# Patient Record
Sex: Female | Born: 2005 | Race: White | Hispanic: No | Marital: Single | Smoking: Never smoker
Health system: Southern US, Community
[De-identification: ages and names within clinical notes are randomized; demographics above are authoritative.]

## PROBLEM LIST (undated history)

## (undated) ENCOUNTER — Inpatient Hospital Stay (HOSPITAL_COMMUNITY): Payer: Self-pay

## (undated) DIAGNOSIS — F32A Depression, unspecified: Secondary | ICD-10-CM

## (undated) DIAGNOSIS — F419 Anxiety disorder, unspecified: Secondary | ICD-10-CM

## (undated) HISTORY — DX: Depression, unspecified: F32.A

## (undated) HISTORY — PX: NO PAST SURGERIES: SHX2092

## (undated) HISTORY — DX: Anxiety disorder, unspecified: F41.9

---
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
## 2024-02-02 ENCOUNTER — Encounter (HOSPITAL_COMMUNITY): Payer: Self-pay

## 2024-02-02 ENCOUNTER — Emergency Department (HOSPITAL_COMMUNITY)
Admission: EM | Admit: 2024-02-02 | Discharge: 2024-02-03 | Disposition: A | Discharge status: Home / Self Care | Attending: Emergency Medicine | Admitting: Emergency Medicine

## 2024-02-02 ENCOUNTER — Emergency Department (HOSPITAL_COMMUNITY)

## 2024-02-02 ENCOUNTER — Other Ambulatory Visit: Payer: Self-pay

## 2024-02-02 DIAGNOSIS — R519 Headache, unspecified: Secondary | ICD-10-CM | POA: Diagnosis present

## 2024-02-02 DIAGNOSIS — Z79899 Other long term (current) drug therapy: Secondary | ICD-10-CM | POA: Diagnosis not present

## 2024-02-02 DIAGNOSIS — H1031 Unspecified acute conjunctivitis, right eye: Secondary | ICD-10-CM | POA: Diagnosis not present

## 2024-02-02 DIAGNOSIS — R55 Syncope and collapse: Secondary | ICD-10-CM | POA: Insufficient documentation

## 2024-02-02 DIAGNOSIS — R000 Tachycardia, unspecified: Secondary | ICD-10-CM | POA: Diagnosis not present

## 2024-02-02 LAB — COMPREHENSIVE METABOLIC PANEL WITH GFR
ALT: 14 U/L (ref 0–44)
AST: 15 U/L (ref 15–41)
Albumin: 4.4 g/dL (ref 3.5–5.0)
Alkaline Phosphatase: 66 U/L (ref 38–126)
Anion gap: 9 (ref 5–15)
BUN: 9 mg/dL (ref 6–20)
CO2: 26 mmol/L (ref 22–32)
Calcium: 9.3 mg/dL (ref 8.9–10.3)
Chloride: 102 mmol/L (ref 98–111)
Creatinine, Ser: 0.64 mg/dL (ref 0.44–1.00)
GFR, Estimated: >60 mL/min (ref >60)
Glucose, Bld: 112 mg/dL — ABNORMAL HIGH (ref 70–99)
Potassium: 3.4 mmol/L — ABNORMAL LOW (ref 3.5–5.1)
Sodium: 137 mmol/L (ref 135–145)
Total Bilirubin: 1 mg/dL (ref 0.0–1.2)
Total Protein: 7.4 g/dL (ref 6.5–8.1)

## 2024-02-02 LAB — CBC
HCT: 44.3 % (ref 36.0–46.0)
Hemoglobin: 14.2 g/dL (ref 12.0–15.0)
MCH: 30.6 pg (ref 26.0–34.0)
MCHC: 32.1 g/dL (ref 30.0–36.0)
MCV: 95.5 fL (ref 80.0–100.0)
Platelets: 262 10*3/uL (ref 150–400)
RBC: 4.64 MIL/uL (ref 3.87–5.11)
RDW: 12.4 % (ref 11.5–15.5)
WBC: 12.1 10*3/uL — ABNORMAL HIGH (ref 4.0–10.5)
nRBC: 0 % (ref 0.0–0.2)

## 2024-02-02 LAB — URINALYSIS, ROUTINE W REFLEX MICROSCOPIC
Bilirubin Urine: NEGATIVE
Glucose, UA: NEGATIVE mg/dL
Ketones, ur: NEGATIVE mg/dL
Nitrite: NEGATIVE
Protein, ur: NEGATIVE mg/dL
Specific Gravity, Urine: 1.018 (ref 1.005–1.030)
pH: 5 (ref 5.0–8.0)

## 2024-02-02 LAB — HCG, SERUM, QUALITATIVE: Preg, Serum: NEGATIVE

## 2024-02-02 MED ORDER — KETOROLAC TROMETHAMINE 30 MG/ML IJ SOLN
30.0000 mg | Freq: Once | INTRAMUSCULAR | Status: AC
  Administered 2024-02-02: 30 mg via INTRAVENOUS
  Filled 2024-02-02: qty 1

## 2024-02-02 MED ORDER — DIPHENHYDRAMINE HCL 50 MG/ML IJ SOLN
25.0000 mg | Freq: Once | INTRAMUSCULAR | Status: AC
  Administered 2024-02-02: 25 mg via INTRAVENOUS
  Filled 2024-02-02: qty 1

## 2024-02-02 MED ORDER — METOCLOPRAMIDE HCL 5 MG/ML IJ SOLN
10.0000 mg | Freq: Once | INTRAMUSCULAR | Status: AC
  Administered 2024-02-02: 10 mg via INTRAVENOUS
  Filled 2024-02-02: qty 2

## 2024-02-02 MED ORDER — LACTATED RINGERS IV BOLUS
1000.0000 mL | Freq: Once | INTRAVENOUS | Status: AC
  Administered 2024-02-02: 1000 mL via INTRAVENOUS

## 2024-02-02 NOTE — ED Notes (Signed)
 Gave pt urine cup with instructions to give to "first look" when sample is obtained.

## 2024-02-02 NOTE — ED Notes (Signed)
 Pt coming from [facility] in [place] due to syncopal episode last night, migraine and tachycardia. Coming POV for evaluation.

## 2024-02-02 NOTE — ED Triage Notes (Signed)
 Patient said her vision got blurry last night and passed out for a few seconds. Stated she has had a migraine since. Denies blood thinners. Feels nauseous.

## 2024-02-02 NOTE — ED Provider Notes (Signed)
 [facility] Emergency Department Provider Note MRN:  [id number]  Arrival date & time: 02/03/24     Chief Complaint   Loss of Consciousness   History of Present Illness   Beverly Dennis is a 18 y.o. year-old female presents to the ED with chief complaint of syncopal event yesterday.  States that she felt confused.  States that she had a bad headache.  States that she woke up and noticed a blood shot eye.  States that she has had a migraine ever since passing out.  States that she is having associated worst headache, nausea, light sensitivity.  States that she has tried taking Tylenol for the headache.  States that before she passed out she was walking around.  She states that she felt like she was going to pass out and sat down.   States that her right eye feels irritated.    She states that the headache started on the right side of her head.    States that she has passed out one time before.   History provided by patient.   Review of Systems  Pertinent positive and negative review of systems noted in HPI.    Physical Exam   Vitals:   02/03/24 0024 02/03/24 0130  BP:  103/60  Pulse:  87  Resp:  18  Temp: 98.2 F (36.8 C)   SpO2:  99%    CONSTITUTIONAL:  non toxic-appearing, NAD NEURO:  Alert and oriented x 3, CN 3-12 grossly intact EYES:  eyes equal and reactive ENT/NECK:  Supple, no stridor  CARDIO:  Tachycardic, regular rhythm, appears well-perfused  PULM:  No respiratory distress, CTAB GI/GU:  non-distended, no focal abdominal tenderness MSK/SPINE:  No gross deformities, no edema, moves all extremities  SKIN:  no rash, atraumatic   *Additional and/or pertinent findings included in MDM below  Diagnostic and Interventional Summary    EKG Interpretation Date/Time:  Friday February 02 2024 23:50:31 EDT Ventricular Rate:  104 PR Interval:  122 QRS Duration:  88 QT Interval:  353 QTC Calculation: 465 R Axis:   97  Text  Interpretation: Sinus tachycardia Ventricular premature complex Borderline right axis deviation Borderline T wave abnormalities Baseline wander in lead(s) I II aVR V3 Confirmed by Townsend Freud [phone]) on 02/02/2024 11:59:22 PM       Labs Reviewed  COMPREHENSIVE METABOLIC PANEL WITH GFR - Abnormal; Notable for the following components:      Result Value   Potassium 3.4 (*)    Glucose, Bld 112 (*)    All other components within normal limits  CBC - Abnormal; Notable for the following components:   WBC 12.1 (*)    All other components within normal limits  URINALYSIS, ROUTINE W REFLEX MICROSCOPIC - Abnormal; Notable for the following components:   APPearance HAZY (*)    Hgb urine dipstick SMALL (*)    Leukocytes,Ua TRACE (*)    Bacteria, UA RARE (*)    All other components within normal limits  RESP PANEL BY RT-PCR (RSV, FLU A&B, COVID)  RVPGX2  HCG, SERUM, QUALITATIVE  TSH  MAGNESIUM  D-DIMER, QUANTITATIVE  CBG MONITORING, ED    CT Head Wo Contrast  Final Result      Medications  ketorolac  (TORADOL ) 30 MG/ML injection 30 mg (30 mg Intravenous Given 02/02/24 2339)  metoCLOPramide  (REGLAN ) injection 10 mg (10 mg Intravenous Given 02/02/24 2338)  diphenhydrAMINE  (BENADRYL ) injection 25 mg (25 mg Intravenous Given 02/02/24 2339)  lactated ringers  bolus 1,000 mL (0  mLs Intravenous Stopped 02/03/24 0130)  potassium chloride SA (KLOR-CON M) CR tablet 40 mEq (40 mEq Oral Given 02/03/24 0127)  lactated ringers  bolus 1,000 mL (1,000 mLs Intravenous New Bag/Given 02/03/24 0127)     Procedures  /  Critical Care Procedures  ED Course and Medical Decision Making  I have reviewed the triage vital signs, the nursing notes, and pertinent available records from the EMR.  Social Determinants Affecting Complexity of Care: Patient has no clinically significant social determinants affecting this chief complaint..   ED Course:    Medical Decision Making Patient here with syncope, headache, and  conjuctivitis.  She states that she passed out yesterday.  She knew she was going to pass out and lowered herself to the ground.    Labs and imaging ordered in triage are reassuring.  She did have a mild hypokalemia, which I treated with oral potassium.  She has a negative pregnancy, so doubt ectopic. Negative d-dimer, doubt PE.  No chest pain or SOB.  She was a bit tachycardic, but this resolved with fluids.    She was able to ambulate without dizziness.    Her headache improved significantly with the migraine cocktail.   I will have her follow-up with outpatient neuro for headaches.  She will need to follow-up with PCP for her syncope, but I don't seen any worrisome findings that would necessitate admission or further emergent workup.  Polytrim for the conjunctivitis.  We discussed return precautions.  Amount and/or Complexity of Data Reviewed Labs: ordered. ECG/medicine tests: ordered.  Risk Prescription drug management.         Consultants: No consultations were needed in caring for this patient.   Treatment and Plan: I considered admission due to patient's initial presentation, but after considering the examination and diagnostic results, patient will not require admission and can be discharged with outpatient follow-up.    Final Clinical Impressions(s) / ED Diagnoses     ICD-10-CM   1. Syncope, unspecified syncope type  R55     2. Acute nonintractable headache, unspecified headache type  R51.9 Ambulatory referral to Neurology    3. Acute conjunctivitis of right eye, unspecified acute conjunctivitis type  H10.31       ED Discharge Orders          Ordered    Ambulatory referral to Neurology       Comments: An appointment is requested in approximately: 2 weeks   02/03/24 0214    trimethoprim-polymyxin b (POLYTRIM) ophthalmic solution  Every 4 hours        02/03/24 0215              Discharge Instructions Discussed with and Provided to Patient:      Discharge Instructions      Please follow-up with  your primary care doctor for further evaluation of your syncope.    Please follow-up with the neurologist for your headaches.  Your scans and labs looked good.  Please return for new or worsening symptoms.  Return for fever.    Take medications as instructed.     Sherel Dikes, PA-C 02/03/24 1610    Lindle Rhea, MD 02/03/24 [phone]

## 2024-02-03 LAB — RESP PANEL BY RT-PCR (RSV, FLU A&B, COVID)  RVPGX2
Influenza A by PCR: NEGATIVE
Influenza B by PCR: NEGATIVE
Resp Syncytial Virus by PCR: NEGATIVE
SARS Coronavirus 2 by RT PCR: NEGATIVE

## 2024-02-03 LAB — D-DIMER, QUANTITATIVE: D-Dimer, Quant: <0.27 ug{FEU}/mL (ref 0.00–0.50)

## 2024-02-03 LAB — MAGNESIUM: Magnesium: 2.3 mg/dL (ref 1.7–2.4)

## 2024-02-03 LAB — TSH: TSH: 0.801 u[IU]/mL (ref 0.350–4.500)

## 2024-02-03 MED ORDER — LACTATED RINGERS IV BOLUS
1000.0000 mL | Freq: Once | INTRAVENOUS | Status: AC
  Administered 2024-02-03: 1000 mL via INTRAVENOUS

## 2024-02-03 MED ORDER — POLYMYXIN B-TRIMETHOPRIM 10000-0.1 UNIT/ML-% OP SOLN: 1.0000 [drp] | OPHTHALMIC | 0 refills | Status: AC

## 2024-02-03 MED ORDER — POTASSIUM CHLORIDE CRYS ER 20 MEQ PO TBCR
40.0000 meq | EXTENDED_RELEASE_TABLET | Freq: Once | ORAL | Status: AC
  Administered 2024-02-03: 40 meq via ORAL
  Filled 2024-02-03: qty 2

## 2024-02-03 NOTE — Discharge Instructions (Addendum)
 Please follow-up with  your primary care doctor for further evaluation of your syncope.    Please follow-up with the neurologist for your headaches.  Your scans and labs looked good.  Please return for new or worsening symptoms.  Return for fever.    Take medications as instructed.

## 2024-04-25 ENCOUNTER — Ambulatory Visit: Admitting: Radiology

## 2024-04-26 ENCOUNTER — Ambulatory Visit: Admitting: Radiology

## 2024-05-01 ENCOUNTER — Encounter: Payer: Self-pay | Admitting: Radiology

## 2024-05-01 ENCOUNTER — Ambulatory Visit: Admitting: Radiology

## 2024-05-01 VITALS — BP 120/62 | Wt 102.0 lb

## 2024-05-01 DIAGNOSIS — N912 Amenorrhea, unspecified: Secondary | ICD-10-CM

## 2024-05-01 DIAGNOSIS — Z3201 Encounter for pregnancy test, result positive: Secondary | ICD-10-CM

## 2024-05-01 LAB — PREGNANCY, URINE: Preg Test, Ur: POSITIVE — AB

## 2024-05-01 NOTE — Progress Notes (Signed)
   Areatha Mall May 31, 2006 [id number]   History:  18 y.o. Complains of late for period. LMP: 03/27/24, hx irregular periods, BC: none (not planning pregnancy) Had positive home pregnancy test last week. Plans on continuing pregnancy. Has not started PNV.   Gynecologic History Patient's last menstrual period was 03/27/2024 (approximate).   Contraception/Family planning: none Sexually active: yes   Obstetric History OB History  Gravida Para Term Preterm AB Living  0 0 0 0 0 0  SAB IAB Ectopic Multiple Live Births  0 0 0 0 0     The following portions of the patient's history were reviewed and updated as appropriate: allergies, current medications, past family history, past medical history, past social history, past surgical history, and problem list.  Review of Systems  All other systems reviewed and are negative.   Past medical history, past surgical history, family history and social history were all reviewed and documented in the EPIC chart.  Exam:  Vitals:   05/01/24 1127  BP: 120/62  Weight: 102 lb (46.3 kg)   Body mass index is 19.27 kg/m.  Physical Exam Vitals and nursing note reviewed.  Constitutional:      Appearance: Normal appearance. She is normal weight.  Neurological:     Mental Status: She is alert.  Psychiatric:        Mood and Affect: Mood normal.        Thought Content: Thought content normal.        Judgment: Judgment normal.      Darice Hoit, CMA present for exam  Assessment/Plan:   1. Positive pregnancy test (Primary)  2. Amenorrhea - Pregnancy, urine; positive  Options counseling completed. Will start PNV. Info given for OB offices and planned parenthood. Discussed nutrition, exercise, warning signs.    GINETTE COZIER B WHNP-BC 12:38 PM 05/01/2024

## 2024-05-10 ENCOUNTER — Other Ambulatory Visit: Payer: Self-pay

## 2024-05-10 ENCOUNTER — Emergency Department (HOSPITAL_BASED_OUTPATIENT_CLINIC_OR_DEPARTMENT_OTHER): Admission: EM | Admit: 2024-05-10 | Discharge: 2024-05-10 | Disposition: A | Discharge status: Home / Self Care

## 2024-05-10 ENCOUNTER — Encounter (HOSPITAL_BASED_OUTPATIENT_CLINIC_OR_DEPARTMENT_OTHER): Payer: Self-pay

## 2024-05-10 ENCOUNTER — Emergency Department (HOSPITAL_BASED_OUTPATIENT_CLINIC_OR_DEPARTMENT_OTHER)

## 2024-05-10 DIAGNOSIS — O200 Threatened abortion: Secondary | ICD-10-CM | POA: Insufficient documentation

## 2024-05-10 DIAGNOSIS — Z3A01 Less than 8 weeks gestation of pregnancy: Secondary | ICD-10-CM | POA: Diagnosis not present

## 2024-05-10 LAB — COMPREHENSIVE METABOLIC PANEL WITH GFR
ALT: 8 U/L (ref 0–44)
AST: 17 U/L (ref 15–41)
Albumin: 4.6 g/dL (ref 3.5–5.0)
Alkaline Phosphatase: 68 U/L (ref 38–126)
Anion gap: 16 — ABNORMAL HIGH (ref 5–15)
BUN: <5 mg/dL — ABNORMAL LOW (ref 6–20)
CO2: 20 mmol/L — ABNORMAL LOW (ref 22–32)
Calcium: 9.2 mg/dL (ref 8.9–10.3)
Chloride: 104 mmol/L (ref 98–111)
Creatinine, Ser: 0.6 mg/dL (ref 0.44–1.00)
GFR, Estimated: >60 mL/min (ref >60)
Glucose, Bld: 94 mg/dL (ref 70–99)
Potassium: 3.9 mmol/L (ref 3.5–5.1)
Sodium: 139 mmol/L (ref 135–145)
Total Bilirubin: 0.2 mg/dL (ref 0.0–1.2)
Total Protein: 6.9 g/dL (ref 6.5–8.1)

## 2024-05-10 LAB — CBC
HCT: 38.2 % (ref 36.0–46.0)
Hemoglobin: 13.1 g/dL (ref 12.0–15.0)
MCH: 30.8 pg (ref 26.0–34.0)
MCHC: 34.3 g/dL (ref 30.0–36.0)
MCV: 89.9 fL (ref 80.0–100.0)
Platelets: 299 K/uL (ref 150–400)
RBC: 4.25 MIL/uL (ref 3.87–5.11)
RDW: 11.4 % — ABNORMAL LOW (ref 11.5–15.5)
WBC: 9.1 K/uL (ref 4.0–10.5)
nRBC: 0 % (ref 0.0–0.2)

## 2024-05-10 LAB — HCG, QUANTITATIVE, PREGNANCY: hCG, Beta Chain, Quant, S: 12256 m[IU]/mL — ABNORMAL HIGH (ref <5)

## 2024-05-10 NOTE — ED Notes (Signed)
 Reviewed discharge instructions and follow up with pt. Pt states understanding. Ambulatory at discharge with friend

## 2024-05-10 NOTE — Discharge Instructions (Addendum)
 As we discussed, your ultrasound showed that you are approximately 6 weeks and 2 days gestation of pregnancy.  Your ultrasound did not show fetal heart tones, however that could be normal given how early along in your pregnancy that you are.  Your hCG was approximately 12,000.  I recommend that you follow-up with your OB/GYN next week to have a repeat ultrasound as well as to have repeat hCG testing to determine the viability of her pregnancy.  In the interim, monitor for any changes in your condition, such as developing severe pain, fevers, or vaginal bleeding.  Return if development of any new or worsening symptoms.

## 2024-05-10 NOTE — ED Triage Notes (Addendum)
 Arrives POV voicing concerns regarding current pregnancy. Patient states that she is about [redacted] weeks pregnant and she had an ultrasound today that showed no heartbeat. Patient here for a second opinion. She is not having vaginal bleeding.

## 2024-05-10 NOTE — ED Provider Notes (Signed)
 [facility] Provider Note   CSN: [id number] Arrival date & time: 05/10/24  1422     Patient presents with: Threatened Miscarriage   Beverly Dennis is a 18 y.o. female.   Patient with no pertinent past medical history presents today with concern for threatened miscarriage.  Reports that she is approximately [redacted] weeks pregnant.  She went to an OB/GYN office today and had an ultrasound and was told that they did not find a heartbeat.  She reports that they did not elaborate on this, just told her to follow-up at her appointment next week.  She presents requesting a second opinion.  Does endorse that she has had some mild abdominal cramping but no significant pain.  Denies nausea, vomiting, or diarrhea.  No vaginal bleeding or discharge.  No dysuria.  She is G1, P0  The history is provided by the patient. No language interpreter was used.       Prior to Admission medications   Medication Sig Start Date End Date Taking? Authorizing Provider  albuterol (PROAIR HFA) 108 (90 Base) MCG/ACT inhaler INHALE 2 PUFFS BY MOUTH EVERY 4 TO 6 HOURS AS NEEDED FOR COUGHING OR WHEEZING 05/10/18   [provider]  drospirenone -ethinyl estradiol  (YAZ) 3-0.02 MG tablet Take 1 tablet by mouth daily. Patient not taking: Reported on 05/01/2024 10/02/23   Glennon Almarie POUR, MD  HYDROXYZINE HCL PO Take by mouth. Patient not taking: Reported on 05/01/2024    [provider]    Allergies: Patient has no known allergies.    Review of Systems  All other systems reviewed and are negative.   Updated Vital Signs BP 121/79 (BP Location: Right Arm)   Pulse (!) 109   Temp 99.2 F (37.3 C)   Resp 20   Ht 5' 1 (1.549 m)   Wt 47.6 kg   LMP 03/27/2024 (Approximate)   SpO2 100%   BMI 19.84 kg/m   Physical Exam Vitals and nursing note reviewed.  Constitutional:      General: She is not in acute distress.    Appearance: Normal appearance. She is normal  weight. She is not ill-appearing, toxic-appearing or diaphoretic.  HENT:     Head: Normocephalic and atraumatic.  Cardiovascular:     Rate and Rhythm: Normal rate.  Pulmonary:     Effort: Pulmonary effort is normal. No respiratory distress.  Abdominal:     General: Abdomen is flat.     Palpations: Abdomen is soft.     Tenderness: There is no abdominal tenderness.  Musculoskeletal:        General: Normal range of motion.     Cervical back: Normal range of motion.  Skin:    General: Skin is warm and dry.  Neurological:     General: No focal deficit present.     Mental Status: She is alert.  Psychiatric:        Mood and Affect: Mood normal.        Behavior: Behavior normal.     (all labs ordered are listed, but only abnormal results are displayed) Labs Reviewed  HCG, QUANTITATIVE, PREGNANCY - Abnormal; Notable for the following components:      Result Value   hCG, Beta Chain, Quant, S 12,256 (*)    All other components within normal limits  CBC - Abnormal; Notable for the following components:   RDW 11.4 (*)    All other components within normal limits  COMPREHENSIVE METABOLIC PANEL WITH GFR -  Abnormal; Notable for the following components:   CO2 20 (*)    BUN <5 (*)    Anion gap 16 (*)    All other components within normal limits    EKG: None  Radiology: US  OB LESS THAN 14 WEEKS WITH OB TRANSVAGINAL Result Date: 05/10/2024 CLINICAL DATA:  Threatened miscarriage in early pregnancy EXAM: OBSTETRIC <14 WK US  AND TRANSVAGINAL OB US  TECHNIQUE: Both transabdominal and transvaginal ultrasound examinations were performed for complete evaluation of the gestation as well as the maternal uterus, adnexal regions, and pelvic cul-de-sac. Transvaginal technique was performed to assess early pregnancy. COMPARISON:  11/07/2023 pelvic ultrasound FINDINGS: Intrauterine gestational sac: Present Yolk sac:  Present Embryo:  Present Cardiac Activity: Absent Heart Rate: N/A bpm CRL:  5.6 mm   6 w    2 d                  US  EDC: 01/01/2025 Subchorionic hemorrhage:  None visualized. Maternal uterus/adnexae: Unremarkable aside from trace free pelvic fluid. IMPRESSION: 1. Single intrauterine pregnancy measuring at 6 weeks 2 days gestation. No currently visible embryonic cardiac activity, which may be due to the early stage of the pregnancy and is not considered indicative of embryonic demise at this time based on the small size of the embryo (crown-rump length a 5.6 mm). Recommend follow-up quantitative B-HCG levels and follow-up ultrasound as clinically warranted to assess viability. Electronically Signed   By: Ryan Salvage M.D.   On: 05/10/2024 17:56     Procedures   Medications Ordered in the ED - No data to display                                  Medical Decision Making Amount and/or Complexity of Data Reviewed Labs: ordered. Radiology: ordered.   This patient is a 18 y.o. female who presents to the ED for concern of pelvic pain in pregnancy, this involves an extensive number of treatment options, and is a complaint that carries with it a high risk of complications and morbidity. The emergent differential diagnosis prior to evaluation includes, but is not limited to,  PID, appendicitis, kidney stone, septic abortion, ruptured ovarian cyst, ovarian torsion, tubo-ovarian abscess, fibroids, endometriosis, diverticulitis, cystitis, ectopic pregnancy, dysmenorrhea, septic abortion  This is not an exhaustive differential.   Past Medical History / Co-morbidities / Social History:  has no past medical history on file.  Patient is G1P0  Additional history: Chart reviewed. Pertinent results include: Unable to review patients visit from earlier today.  Physical Exam: Physical exam performed. The pertinent findings include: Well-appearing, abdomen soft and nontender.  Lab Tests: I ordered, and personally interpreted labs.  The pertinent results include:  hcg quantitative 12,256.  No  other acute laboratory abnormalities.   Imaging Studies: I ordered imaging studies including Pelvic US . I independently visualized and interpreted imaging which showed   1. Single intrauterine pregnancy measuring at 6 weeks 2 days gestation. No currently visible embryonic cardiac activity, which may be due to the early stage of the pregnancy and is not considered indicative of embryonic demise at this time based on the small size of the embryo (crown-rump length a 5.6 mm). Recommend follow-up quantitative B-HCG levels and follow-up ultrasound as clinically warranted to assess viability.  I agree with the radiologist interpretation.   Disposition: After consideration of the diagnostic results and the patients response to treatment, I feel that emergency department workup does not  suggest an emergent condition requiring admission or immediate intervention beyond what has been performed at this time. The plan is: Discharge with close outpatient OB/GYN follow-up and return precautions.  Patient's workup is benign.  Her abdomen is soft and nontender.  It her ultrasound did not show fetal heart tones, however it could be due to early stage of pregnancy.  Discussed same with patient is understanding.  She is not having any bleeding or spotting.  Her pain seems minimal and therefore low suspicion for acute intra or abdominal etiology of symptoms such as ovarian torsion or appendicitis.  No indication for further evaluation with additional labs or imaging at this time.  Recommend that she see her OB/GYN at her already scheduled appointment next week to trend her hCG and have a repeat ultrasound to determine viability of pregnancy.  Close return precautions provided. Evaluation and diagnostic testing in the emergency department does not suggest an emergent condition requiring admission or immediate intervention beyond what has been performed at this time.  Plan for discharge with close PCP follow-up.  Patient is  understanding and amenable with plan, educated on red flag symptoms that would prompt immediate return.  Patient discharged in stable condition.  Final diagnoses:  Less than [redacted] weeks gestation of pregnancy    ED Discharge Orders     None     An After Visit Summary was printed and given to the patient.      Kamren Heskett A, PA-C 05/10/24 1818    Simon Lavonia SAILOR, MD 05/10/24 [phone]

## 2024-05-14 ENCOUNTER — Ambulatory Visit: Payer: Medicaid Other | Admitting: Radiology

## 2024-05-17 ENCOUNTER — Encounter: Payer: Self-pay | Admitting: *Deleted

## 2024-05-20 ENCOUNTER — Other Ambulatory Visit (HOSPITAL_COMMUNITY)
Admission: RE | Admit: 2024-05-20 | Discharge: 2024-05-20 | Disposition: A | Discharge status: Home / Self Care | Source: Ambulatory Visit | Attending: Obstetrics and Gynecology | Admitting: Obstetrics and Gynecology

## 2024-05-20 ENCOUNTER — Other Ambulatory Visit (INDEPENDENT_AMBULATORY_CARE_PROVIDER_SITE_OTHER): Payer: Self-pay

## 2024-05-20 ENCOUNTER — Ambulatory Visit: Admitting: *Deleted

## 2024-05-20 VITALS — BP 112/70 | HR 96

## 2024-05-20 DIAGNOSIS — Z3480 Encounter for supervision of other normal pregnancy, unspecified trimester: Secondary | ICD-10-CM | POA: Insufficient documentation

## 2024-05-20 DIAGNOSIS — Z3A01 Less than 8 weeks gestation of pregnancy: Secondary | ICD-10-CM

## 2024-05-20 DIAGNOSIS — Z3401 Encounter for supervision of normal first pregnancy, first trimester: Secondary | ICD-10-CM | POA: Diagnosis not present

## 2024-05-20 DIAGNOSIS — O3680X0 Pregnancy with inconclusive fetal viability, not applicable or unspecified: Secondary | ICD-10-CM

## 2024-05-20 DIAGNOSIS — O0990 Supervision of high risk pregnancy, unspecified, unspecified trimester: Secondary | ICD-10-CM

## 2024-05-20 LAB — CERVICOVAGINAL ANCILLARY ONLY
Chlamydia: POSITIVE — AB
Comment: NEGATIVE
Comment: NORMAL
Neisseria Gonorrhea: NEGATIVE

## 2024-05-20 NOTE — Progress Notes (Signed)
 New OB Intake  I connected with Beverly Dennis  on 05/20/24 at  9:15 AM EDT by In Person Visit and verified that I am speaking with the correct person using two identifiers. Nurse is located at [facility] and pt is located at [place].  I discussed the limitations, risks, security and privacy concerns of performing an evaluation and management service by telephone and the availability of in person appointments. I also discussed with the patient that there may be a patient responsible charge related to this service. The patient expressed understanding and agreed to proceed.  I explained I am completing New OB Intake today. We discussed EDD of 01/01/25 based on LMP of 03/27/24. Pt is G1P0000. I reviewed her allergies, medications and Medical/Surgical/OB history.    There are no active problems to display for this patient.    Concerns addressed today  Delivery Plans Plans to deliver at [facility]. Discussed the nature of our practice with multiple providers including residents and students as well as female and female providers. Due to the size of the practice, the delivering provider may not be the same as those providing prenatal care.   Patient Not Asked interested in water birth.  MyChart/Babyscripts MyChart access verified. I explained pt will have some visits in office and some virtually. Babyscripts instructions given and order placed. Patient verifies receipt of registration text/e-mail. Account successfully created and app downloaded. If patient is a candidate for Optimized scheduling, add to sticky note.   Blood Pressure Cuff/Weight Scale Pt has BP cuff at home. Explained after first prenatal appt pt will check weekly and document in Babyscripts. Patient does not have weight scale; patient may purchase if they desire to track weight weekly in Babyscripts.  Anatomy US  Explained first scheduled US  will be around 19 weeks. Anatomy US  scheduled for TBD at TBD.  Is patient a candidate for  Babyscripts Optimization? No, due to Risk Factors   First visit review I reviewed new OB appt with patient. Explained pt will be seen by Nidia Daring, NP at first visit. Discussed [place] genetic screening with patient. Requests [facility] and [employer].. Routine prenatal labs OB Urine, GC/CC collected at today's visit. Initial OB labs deferred to New OB appt.   Last Pap No results found for: DIAGPAP  Rocky CHRISTELLA Ober, RN 05/20/2024  9:26 AM

## 2024-05-20 NOTE — Patient Instructions (Signed)

## 2024-05-21 ENCOUNTER — Other Ambulatory Visit: Payer: Self-pay

## 2024-05-21 ENCOUNTER — Ambulatory Visit: Payer: Self-pay | Admitting: Obstetrics and Gynecology

## 2024-05-21 DIAGNOSIS — A749 Chlamydial infection, unspecified: Secondary | ICD-10-CM | POA: Insufficient documentation

## 2024-05-21 MED ORDER — AZITHROMYCIN 500 MG PO TABS: 1000.0000 mg | ORAL_TABLET | Freq: Once | ORAL | 0 refills | Status: AC

## 2024-05-24 LAB — CULTURE, OB URINE

## 2024-05-24 LAB — URINE CULTURE, OB REFLEX

## 2024-06-10 ENCOUNTER — Other Ambulatory Visit

## 2024-06-11 ENCOUNTER — Other Ambulatory Visit

## 2024-06-11 DIAGNOSIS — Z3401 Encounter for supervision of normal first pregnancy, first trimester: Secondary | ICD-10-CM

## 2024-06-11 DIAGNOSIS — Z31430 Encounter of female for testing for genetic disease carrier status for procreative management: Secondary | ICD-10-CM

## 2024-06-11 DIAGNOSIS — Z3480 Encounter for supervision of other normal pregnancy, unspecified trimester: Secondary | ICD-10-CM

## 2024-06-12 ENCOUNTER — Ambulatory Visit: Payer: Self-pay | Admitting: Obstetrics and Gynecology

## 2024-06-12 LAB — CBC/D/PLT+RPR+RH+ABO+RUBIGG...
Antibody Screen: NEGATIVE
Basophils Absolute: 0.1 x10E3/uL (ref 0.0–0.2)
Basos: 1 %
EOS (ABSOLUTE): 0.4 x10E3/uL (ref 0.0–0.4)
Eos: 4 %
HCV Ab: NONREACTIVE
HIV Screen 4th Generation wRfx: NONREACTIVE
Hematocrit: 36.8 % (ref 34.0–46.6)
Hemoglobin: 12.1 g/dL (ref 11.1–15.9)
Hepatitis B Surface Ag: NEGATIVE
Immature Grans (Abs): 0 x10E3/uL (ref 0.0–0.1)
Immature Granulocytes: 0 %
Lymphocytes Absolute: 2.8 x10E3/uL (ref 0.7–3.1)
Lymphs: 30 %
MCH: 30.8 pg (ref 26.6–33.0)
MCHC: 32.9 g/dL (ref 31.5–35.7)
MCV: 94 fL (ref 79–97)
Monocytes Absolute: 0.7 x10E3/uL (ref 0.1–0.9)
Monocytes: 7 %
Neutrophils Absolute: 5.5 x10E3/uL (ref 1.4–7.0)
Neutrophils: 58 %
Platelets: 271 x10E3/uL (ref 150–450)
RBC: 3.93 x10E6/uL (ref 3.77–5.28)
RDW: 11.8 % (ref 11.7–15.4)
RPR Ser Ql: NONREACTIVE
Rh Factor: POSITIVE
Rubella Antibodies, IGG: 2.33 {index} (ref >0.99)
WBC: 9.4 x10E3/uL (ref 3.4–10.8)

## 2024-06-12 LAB — HCV INTERPRETATION

## 2024-06-12 LAB — HEMOGLOBIN A1C
Est. average glucose Bld gHb Est-mCnc: 88 mg/dL
Hgb A1c MFr Bld: 4.7 % — ABNORMAL LOW (ref 4.8–5.6)

## 2024-06-17 ENCOUNTER — Ambulatory Visit: Admitting: Obstetrics and Gynecology

## 2024-06-17 ENCOUNTER — Other Ambulatory Visit (HOSPITAL_COMMUNITY)
Admission: RE | Admit: 2024-06-17 | Discharge: 2024-06-17 | Disposition: A | Discharge status: Home / Self Care | Source: Ambulatory Visit | Attending: Obstetrics and Gynecology | Admitting: Obstetrics and Gynecology

## 2024-06-17 ENCOUNTER — Encounter: Payer: Self-pay | Admitting: Obstetrics and Gynecology

## 2024-06-17 VITALS — BP 115/74 | HR 98 | Wt 109.8 lb

## 2024-06-17 DIAGNOSIS — F32A Depression, unspecified: Secondary | ICD-10-CM

## 2024-06-17 DIAGNOSIS — Z3A11 11 weeks gestation of pregnancy: Secondary | ICD-10-CM | POA: Diagnosis not present

## 2024-06-17 DIAGNOSIS — A749 Chlamydial infection, unspecified: Secondary | ICD-10-CM | POA: Diagnosis present

## 2024-06-17 DIAGNOSIS — R8271 Bacteriuria: Secondary | ICD-10-CM

## 2024-06-17 DIAGNOSIS — Z3480 Encounter for supervision of other normal pregnancy, unspecified trimester: Secondary | ICD-10-CM

## 2024-06-17 DIAGNOSIS — O98811 Other maternal infectious and parasitic diseases complicating pregnancy, first trimester: Secondary | ICD-10-CM

## 2024-06-17 NOTE — Progress Notes (Signed)
 Pt concerned her hair has been falling out since pregnancy. Would like to discuss this with provider.

## 2024-06-17 NOTE — Progress Notes (Signed)
 Subjective:   Beverly Dennis is a 18 y.o. G1P0000 at [redacted]w[redacted]d by LMP c/w US  at 7w being seen today for her first obstetrical visit. This is her first pregnancy and has Supervision of other normal pregnancy, antepartum; Abdominal mass; Bipolar disorder (HCC); Depressive disorder; Suicidal thoughts; Chlamydia infection affecting pregnancy; GBS bacteriuria; and Endometriosis on their problem list. Patient does intend to breast feed. Pregnancy history fully reviewed.  Patient reports no complaints.  HISTORY: OB History  Gravida Para Term Preterm AB Living  1 0 0 0 0 0  SAB IAB Ectopic Multiple Live Births  0 0 0 0 0    # Outcome Date GA Lbr Len/2nd Weight Sex Type Anes PTL Lv  1 Current            Past Medical History:  Diagnosis Date   Anxiety    Depression    Past Surgical History:  Procedure Laterality Date   NO PAST SURGERIES     Family History  Problem Relation Age of Onset   Ovarian cancer Mother 75 - 4   Cancer - Ovarian Mother    Breast cancer Mother    Breast cancer Maternal Aunt    Ovarian cancer Maternal Aunt    Diabetes Maternal Grandfather    Social History   Tobacco Use   Smoking status: Never    Passive exposure: Past   Smokeless tobacco: Never  Vaping Use   Vaping status: Every Day   Devices: decreasing amt since discovering pregnancy  Substance Use Topics   Alcohol use: Not Currently    Comment: occ   Drug use: Not Currently    Types: Marijuana    Comment: weekly   No Known Allergies Current Outpatient Medications on File Prior to Visit  Medication Sig Dispense Refill   Prenatal MV-Min-Fe Fum-FA-DHA (PRENATAL 1 PO) Take 1 tablet by mouth daily.     albuterol (PROAIR HFA) 108 (90 Base) MCG/ACT inhaler INHALE 2 PUFFS BY MOUTH EVERY 4 TO 6 HOURS AS NEEDED FOR COUGHING OR WHEEZING (Patient not taking: Reported on 06/17/2024)     drospirenone -ethinyl estradiol  (YAZ) 3-0.02 MG tablet Take 1 tablet by mouth daily. (Patient not taking: Reported on  06/17/2024) 28 tablet 11   HYDROXYZINE HCL PO Take by mouth. (Patient not taking: Reported on 06/17/2024)     No current facility-administered medications on file prior to visit.   Exam   Vitals:   06/17/24 0908  BP: 115/74  Pulse: 98  Weight: 49.8 kg   Fetal Heart Rate (bpm): 172  Constitutional: well developed, well nourished, no distress HEENT: normocephalic CV: normal rate Pulm/chest wall: normal effort Abdomen: soft, non-distended Neuro: alert and oriented x 3 Skin: warm, dry Psych: affect normal  Assessment:   Pregnancy: G1P0000 Patient Active Problem List   Diagnosis Date Noted   GBS bacteriuria 06/17/2024   Chlamydia infection affecting pregnancy 05/21/2024   Supervision of other normal pregnancy, antepartum 05/20/2024   Bipolar disorder (HCC) 07/12/2023   Depressive disorder 07/12/2023   Endometriosis 06/28/2023   Suicidal thoughts 05/30/2023   Abdominal mass 05/02/2023   Plan:   1. Supervision of other normal pregnancy, antepartum (Primary) - Feeling well today and throughout her first trimester - No indications for early GTT or aspirin therapy - Possible diagnosis of endometriosis, managed by OCPs prior to pregnancy, pain much improved since becoming pregnant, follow up with GYN postpartum  2. [redacted] weeks gestation of pregnancy - Prenatal visit schedule reviewed q 4 weeks  3.  GBS bacteriuria - Present in urine culture (result in Care Everywhere 05/16/24), treat at delivery  4. Chlamydia infection affecting pregnancy in first trimester - azithromycin  completed 05/21/24 - asymptomatic, TOC today - Cervicovaginal ancillary only( [place])  5. Depressive disorder - Follows with therapist - PHQ-9 and GAD-7 score = 0 - Reviewed integrated behavioral health available in our office if needed   Initial lab results reviewed. Continue prenatal vitamins. Discussed genetic screening; NIPS and carrier screening drawn at new OB intake and results are  pending. Ultrasound discussed; fetal anatomic survey: scheduled 08/13/24. Problem list reviewed and updated. The nature of [facility] with multiple MDs and other Advanced Practice Providers was explained to patient; also emphasized that residents, students are part of our team. Routine obstetric precautions reviewed. Return in about 4 weeks (around 07/15/2024) for OB VISIT (MD or APP).  Vernell Ruddle, SNM 06/17/2024 12:33 PM

## 2024-06-17 NOTE — Patient Instructions (Signed)
 We highly recommend childbirth education to help you plan for labor and begin practicing coping skills (which will be needed with or without pain meds).  [place] Childbirth Education Options: Sign up by visiting [email or web address]  Childbirth ~ Self-Paced eClass (English and Spanish) This online class offers you the freedom to complete a childbirth education series in the comfort of your own home at your own pace.  Childbirth Class (In-Person 4-Week Series  or on Saturdays, Virtual 4-Week Series ~ [place]) This interactive in-person class series will help you and your partner prepare for your birth experience. Topics include: Labor & Birth, Comfort Measures, Breathing Techniques, Massage, Medical Interventions, Pain Management Options, Cesarean Birth, Postpartum Care, and Newborn Care  Comfort Techniques for Labor ~ In-Person Class [facility]) This interactive class is designed for parents-to-be who want to learn & practice hands-on skills to help relieve some of the discomfort of labor and encourage their babies to rotate toward the best position for birth. Moms and their partners will be able to try a variety of labor positions with birth balls and rebozos as well as practice breathing, relaxation, and visualization techniques.  Natural Childbirth Class (In-Person 5-Week Series, In-Person on Saturdays or Virtual 5-Week Series ~ [place]) This class series is designed for expectant parents who want to learn and practice natural methods of coping with the process of labor and childbirth.  Cesarean Birth Self-Paced eClass (English and Spanish) This online course provides comprehensive information you can trust as you prepare for a possible cesarean birth. In this class, you'll learn how to make your birth and recovery comfortable and joyful through instructive video clips, animations, and activities.  Waterbirth ~ Airline pilot Interested in a waterbirth? In addition to a consultation  with your credentialed waterbirth provider, this free, informational online class will help you discover whether waterbirth is the right fit for you. Not all obstetrical practices offer waterbirth, so check with your healthcare provider.  Tour Probation officer) - [facility] [employer] our 4 minute video tour of [facility] located in [place].   [place] Parenting Education Options:  Pregnancy 101 (Virtual) Congratulations on your pregnancy! This class is geared toward moms in their first trimester, but everyone is welcome. We are excited to guide you through all aspects of supporting a healthy pregnancy. You will learn what to expect at routine prenatal care appointments, common postpartum adjustments, basic infant safety, and breastfeeding.  Successful Partnering & Parenting ~ In-Person Workshop [facility]) This workshop inspires and equips partners of all economic levels, ages, and cultures to confidently care for their infants, support the birthing persons, and navigate their own transformations into new partners and parents. Learning activities are geared towards supporting partner, but moms are welcome to attend.  'Baby & Me' Parenting Group (Virtual on Wednesdays at 11am) Enjoy this time discussing newborn & infant parenting topics and family adjustment issues with other new parents in a relaxed environment. Each week brings a new speaker or baby-centered activity. This group offers support and connection to parents as they journey through the adjustments and struggles of that sometimes overwhelming first year after the birth of a child.  Baby Safety, CPR, & Choking Class ~ Virtual This life-saving information is meant to encourage parents as they learn important safety and prevention tips as well as infant CPR and relief of choking.  Breastfeeding Class (In-Person in [place] or [employer]) Families learn what to expect in the  first days and weeks of breastfeeding your  newborn. IF YOU ARE AN EMPLOYEE TAKING THIS CLASS FOR CREDIT, DO NOT register yourself. Please e-mail [email or web address].   Breastfeeding Self-Paced eClass (English & Spanish) Families learn what to expect in the first days and weeks of breastfeeding your newborn.  Caring for Baby ~ In-Person, Virtual or Self-Paced Class This in-person class is for both expectant and adoptive parents who want to learn and practice the most up-to-date newborn care for their babies. Focus is on birth through the first six weeks of life.  CPR & Choking Relief for Infants & Children ~ In-Person Class [facility]) This in-person course is designed for any parent, expectant parent, or adult who cares for infants or children. Participants learn and demonstrate cardiopulmonary resuscitation and choking relief procedures for both infants and children.  Grandparent Love ~ In-Person Class Grandparents will learn the most updated infant care and safety recommendations. They will discover ways to support their own children during the transition into the parenting role and receive tips on communicating with the new parents.  [place] Parenting Support Group Options:  Bereavement Grief Support Group (Pregnancy/Infant Loss) - Virtual This is an ongoing experience that meets once a month and is designed to help you honor the past, assist you in discovering tools to strengthen you today, and aid you in developing hope for the future.  Breastfeeding & Pumping Support Group (In-Person on Thursdays at 12pm or Virtual on Tuesdays at 5pm) Join [place] in-person each Thursday starting June 1st, 2023 at 12pm! This support group is free for all families looking for breastfeeding and/or pumping support.   Community-Based Childbirth Education Options:  [facility] Department Classes:  Childbirth education classes can help you get ready for a positive parenting experience. You  can also meet other expectant parents and get free stuff for your baby. Each class runs for five weeks on the same night and costs $45 for the mother-to-be and her support person. Medicaid covers the cost if you are eligible. Call [phone] to register.  [employer] offers a variety of programs for the [employer] and is another great way to get connected. Please go to [email or web address] for more information.  Childbirth With A Twist! Be informed of your options, get educated on birth, understand what your body is doing, learn how to cope, and have a lot of fun and laughs all while doing it either from the comfort of your couch OR in our cozy office and classroom space near the [place] airport. If you are taking a virtual class, then class is taught LIVE, so you can ask questions and receive answers in real-time from an experienced doula and childbirth educator.  This virtual childbirth education class will meet for five instruction times online.  Although we are based in [place], [place], this virtual class is open to anyone in the world. Please visit: [email or web address] for more information.  Books We Love: The Doula Guide to Childbirth by Harland German and Otila Back The First-Time Parent's Childbirth Handbook by Dr. Amie Critchley, CNM The Birth Partner by Truddie Crumble

## 2024-06-18 LAB — PANORAMA PRENATAL TEST FULL PANEL:PANORAMA TEST PLUS 5 ADDITIONAL MICRODELETIONS: FETAL FRACTION: 8.7

## 2024-06-18 LAB — HORIZON CUSTOM: REPORT SUMMARY: NEGATIVE

## 2024-06-19 LAB — CERVICOVAGINAL ANCILLARY ONLY
Chlamydia: POSITIVE — AB
Comment: NEGATIVE
Comment: NEGATIVE
Comment: NORMAL
Neisseria Gonorrhea: NEGATIVE
Trichomonas: NEGATIVE

## 2024-06-20 ENCOUNTER — Other Ambulatory Visit: Payer: Self-pay

## 2024-06-20 ENCOUNTER — Ambulatory Visit: Payer: Self-pay | Admitting: Obstetrics and Gynecology

## 2024-06-20 DIAGNOSIS — A749 Chlamydial infection, unspecified: Secondary | ICD-10-CM

## 2024-06-20 MED ORDER — AZITHROMYCIN 500 MG PO TABS: 1000.0000 mg | ORAL_TABLET | Freq: Once | ORAL | 0 refills | Status: AC

## 2024-07-16 ENCOUNTER — Ambulatory Visit: Admitting: Obstetrics and Gynecology

## 2024-07-16 VITALS — BP 139/74 | HR 115 | Wt 110.0 lb

## 2024-07-16 DIAGNOSIS — Z3A15 15 weeks gestation of pregnancy: Secondary | ICD-10-CM

## 2024-07-16 DIAGNOSIS — R8271 Bacteriuria: Secondary | ICD-10-CM

## 2024-07-16 DIAGNOSIS — A749 Chlamydial infection, unspecified: Secondary | ICD-10-CM

## 2024-07-16 DIAGNOSIS — O98812 Other maternal infectious and parasitic diseases complicating pregnancy, second trimester: Secondary | ICD-10-CM

## 2024-07-16 DIAGNOSIS — R45851 Suicidal ideations: Secondary | ICD-10-CM | POA: Diagnosis not present

## 2024-07-16 DIAGNOSIS — F319 Bipolar disorder, unspecified: Secondary | ICD-10-CM

## 2024-07-16 DIAGNOSIS — F32A Depression, unspecified: Secondary | ICD-10-CM

## 2024-07-16 DIAGNOSIS — Z3480 Encounter for supervision of other normal pregnancy, unspecified trimester: Secondary | ICD-10-CM

## 2024-07-16 MED ORDER — FAMOTIDINE 20 MG PO TABS: 20.0000 mg | ORAL_TABLET | Freq: Two times a day (BID) | ORAL | 3 refills | Status: AC

## 2024-07-16 MED ORDER — PRENATAL PLUS VITAMIN/MINERAL 27-1 MG PO TABS: 1.0000 | ORAL_TABLET | Freq: Every day | ORAL | 3 refills | Status: DC

## 2024-07-16 NOTE — Progress Notes (Signed)
   PRENATAL VISIT NOTE  Subjective:  Beverly Dennis is a 18 y.o. G1P0000 at [redacted]w[redacted]d being seen today for ongoing prenatal care.  She is currently monitored for the following issues for this low-risk pregnancy and has Supervision of other normal pregnancy, antepartum; Bipolar disorder (HCC); Depressive disorder; Suicidal thoughts; Chlamydia infection affecting pregnancy; and GBS bacteriuria on their problem list.  Patient reports cold last week, now nauseated, vomiting and having gas/cramping. Cramping in rectal area. No constipation. Hair loss. Reflex  Contractions: Not present. Vag. Bleeding: None.   . Denies leaking of fluid.   The following portions of the patient's history were reviewed and updated as appropriate: allergies, current medications, past family history, past medical history, past social history, past surgical history and problem list.   Objective:   Vitals:   07/16/24 0905  BP: 139/74  Pulse: (!) 115  Weight: 110 lb (49.9 kg)    Fetal Status: Fetal Heart Rate (bpm): 160         General:  Alert, oriented and cooperative. Patient is in no acute distress.  Skin: Skin is warm and dry. No rash noted.   Cardiovascular: Normal heart rate noted  Respiratory: Normal respiratory effort, no problems with respiration noted  Abdomen: Soft, gravid, appropriate for gestational age.  Pain/Pressure: Absent      Assessment and Plan:  Pregnancy: G1P0000 at [redacted]w[redacted]d 1. [redacted] weeks gestation of pregnancy (Primary) 5. Supervision of other normal pregnancy, antepartum AFP next visit Rx for pepcid, PNV Anatomy scheduled 12/16  2. GBS bacteriuria PCN in labor  3. Chlamydia infection affecting pregnancy in first trimester TOC next visit  4. Depressive disorder 6. Bipolar affective disorder, remission status unspecified (HCC) 7. Suicidal thoughts No meds PHQ 9 0 at NOB, aware of IBH availability No mood complaints today  Please refer to After Visit Summary for other counseling  recommendations.   Return in about 4 weeks (around 08/13/2024).  Future Appointments  Date Time Provider Department Center  08/13/2024 10:00 AM [facility] PROVIDER 1 [facility]  08/13/2024 10:30 AM [facility] US1 [facility]   Kieth JAYSON Carolin, MD

## 2024-07-16 NOTE — Patient Instructions (Signed)
 GasX (simethicone) - helps with gas pain

## 2024-07-16 NOTE — Progress Notes (Signed)
 Pt request to have AFP at next appt.

## 2024-08-05 DIAGNOSIS — Z7289 Other problems related to lifestyle: Secondary | ICD-10-CM | POA: Insufficient documentation

## 2024-08-12 ENCOUNTER — Encounter: Admitting: Obstetrics and Gynecology

## 2024-08-13 ENCOUNTER — Ambulatory Visit: Attending: Obstetrics and Gynecology

## 2024-08-13 ENCOUNTER — Other Ambulatory Visit

## 2024-08-13 VITALS — BP 109/72

## 2024-08-13 DIAGNOSIS — A749 Chlamydial infection, unspecified: Secondary | ICD-10-CM

## 2024-08-13 DIAGNOSIS — O0992 Supervision of high risk pregnancy, unspecified, second trimester: Secondary | ICD-10-CM

## 2024-08-13 DIAGNOSIS — O98812 Other maternal infectious and parasitic diseases complicating pregnancy, second trimester: Secondary | ICD-10-CM

## 2024-08-13 DIAGNOSIS — Z7289 Other problems related to lifestyle: Secondary | ICD-10-CM

## 2024-08-13 DIAGNOSIS — O99332 Smoking (tobacco) complicating pregnancy, second trimester: Secondary | ICD-10-CM | POA: Diagnosis not present

## 2024-08-13 DIAGNOSIS — F17290 Nicotine dependence, other tobacco product, uncomplicated: Secondary | ICD-10-CM | POA: Diagnosis not present

## 2024-08-13 DIAGNOSIS — O219 Vomiting of pregnancy, unspecified: Secondary | ICD-10-CM | POA: Diagnosis not present

## 2024-08-13 DIAGNOSIS — Z363 Encounter for antenatal screening for malformations: Secondary | ICD-10-CM | POA: Insufficient documentation

## 2024-08-13 DIAGNOSIS — O99342 Other mental disorders complicating pregnancy, second trimester: Secondary | ICD-10-CM | POA: Diagnosis not present

## 2024-08-13 DIAGNOSIS — Z3A19 19 weeks gestation of pregnancy: Secondary | ICD-10-CM | POA: Insufficient documentation

## 2024-08-13 DIAGNOSIS — O0990 Supervision of high risk pregnancy, unspecified, unspecified trimester: Secondary | ICD-10-CM | POA: Diagnosis present

## 2024-08-13 NOTE — Progress Notes (Signed)
 Patient information  Patient Name: Beverly Dennis  Patient MRN:   [id number]  Referring practice: MFM Referring Provider: [facility]  Problem List   Patient Active Problem List   Diagnosis Date Noted   Current every day vaping 08/05/2024   GBS bacteriuria 06/17/2024   Chlamydia infection affecting pregnancy 05/21/2024   Supervision of other normal pregnancy, antepartum 05/20/2024   Bipolar disorder (HCC) 07/12/2023   Depressive disorder 07/12/2023   Suicidal thoughts 05/30/2023   Maternal Fetal Medicine Consult Beverly Dennis is a 18 y.o. G1P0000 at [redacted]w[redacted]d here for ultrasound and consultation. She had low risk aneuploidy screening of a female fetus. Carrier screening was Negative for the basic screening (SMA, alpha-thal, beta-thal, and cystic fibroisis. Maternal serum AFP n/a. She has no acute concerns.   Today we focused on the following:   The patient was counseled regarding nicotine device use during pregnancy. She reports current use of a nicotine device approximately twice daily. We reviewed that nicotine exposure in pregnancy is associated with placental vasoconstriction and impaired uteroplacental perfusion, increasing the risk of fetal growth restriction, preterm birth, stillbirth, and hypertensive disorders of pregnancy. Nicotine delivery devices (including pouches and vaping products) are not considered safe alternatives to combustible tobacco in pregnancy, as nicotine itself is the primary driver of these risks, and fetal exposure occurs regardless of delivery method.  Her estimated due date will be revised, as her menstrual cycles are not 28 days. The EDD is 01/06/25 based on her early ultrasound, which places the estimated fetal weight at the lower end of normal. We discussed that this finding is likely representative of constitutional growth, given the size of the patient and the father of the baby; however, there may also be contributing factors related to nicotine  exposure as well as suboptimal oral intake due to nausea and vomiting. She is currently taking vitamin B6, Unisom, and ondansetron (Zofran) with limited relief. Alternative antiemetic strategies were discussed, including the use of metoclopramide  (Reglan ), which she will review further with her OB provider.  Recommendations -Encourage complete cessation of nicotine-containing products for the remainder of pregnancy and postpartum -Provide brief cessation counseling at each visit and offer referral to evidence-based cessation resources (e.g., quitline, behavioral counseling) -If cessation is not achievable with counseling alone, discuss risks and benefits of nicotine replacement therapy on an individualized basis in coordination with obstetrics; the role of pharmacologic agents to reduce cravings was discussed, but the patient declined, and if reconsidered, this should be done under the guidance of an experienced provider given her history of bipolar disorder, which may be exacerbated by traditional antidepressants -Screen for ongoing nicotine use at follow-up visits and reinforce cessation efforts -OB provider to consider metoclopramide  (Reglan ) before meals and at bedtime for management of nausea and vomiting, with continued monitoring of symptoms and hydration status -Screen for ongoing nicotine use and severity of nausea and vomiting at follow-up visits and adjust management as needed -Encourage avoidance of secondhand nicotine exposure in the home and work environment  60 minutes of time was spent reviewing the patient's chart including labs, imaging and documentation.  At least 50% of this time was spent with direct patient care discussing the diagnosis, management and prognosis of her care.  Review of Systems: A review of systems was performed and was negative except per HPI   Past Obstetrical History:  OB History  Gravida Para Term Preterm AB Living  1 0 0 0 0 0  SAB IAB Ectopic Multiple  Live Births  0 0 0 0 0    # Outcome Date GA Lbr Len/2nd Weight Sex Type Anes PTL Lv  1 Current              Past Medical History:  Past Medical History:  Diagnosis Date   Anxiety    Depression      Past Surgical History:    Past Surgical History:  Procedure Laterality Date   NO PAST SURGERIES       Home Medications:   Medications Ordered Prior to Encounter[1]    Allergies:   Allergies[2]   Physical Exam:   Vitals:   08/13/24 1010  BP: 109/72   Sitting comfortably on the sonogram table Nonlabored breathing Normal rate and rhythm Abdomen is nontender  Thank you for the opportunity to be involved with this patient's care. Please let us  know if we can be of any further assistance.   Delora Smaller MFM, [place]   08/13/2024  11:22 AM      [1]  Current Outpatient Medications on File Prior to Visit  Medication Sig Dispense Refill   Prenatal MV-Min-Fe Fum-FA-DHA (PRENATAL 1 PO) Take 1 tablet by mouth daily.     Prenatal Vit-Fe Fumarate-FA (PRENATAL PLUS VITAMIN/MINERAL) 27-1 MG TABS Take 1 tablet by mouth daily at 6 (six) AM. 90 tablet 3   albuterol (PROAIR HFA) 108 (90 Base) MCG/ACT inhaler INHALE 2 PUFFS BY MOUTH EVERY 4 TO 6 HOURS AS NEEDED FOR COUGHING OR WHEEZING (Patient not taking: Reported on 06/17/2024)     famotidine  (PEPCID ) 20 MG tablet Take 1 tablet (20 mg total) by mouth 2 (two) times daily. 60 tablet 3   No current facility-administered medications on file prior to visit.  [2] No Known Allergies

## 2024-08-15 ENCOUNTER — Inpatient Hospital Stay (HOSPITAL_COMMUNITY)
Admission: AD | Admit: 2024-08-15 | Discharge: 2024-08-16 | Disposition: A | Discharge status: Home / Self Care | Attending: Obstetrics and Gynecology | Admitting: Obstetrics and Gynecology

## 2024-08-15 DIAGNOSIS — Z3A19 19 weeks gestation of pregnancy: Secondary | ICD-10-CM | POA: Insufficient documentation

## 2024-08-15 DIAGNOSIS — O209 Hemorrhage in early pregnancy, unspecified: Secondary | ICD-10-CM | POA: Diagnosis present

## 2024-08-15 DIAGNOSIS — O26852 Spotting complicating pregnancy, second trimester: Secondary | ICD-10-CM | POA: Diagnosis not present

## 2024-08-15 DIAGNOSIS — Z113 Encounter for screening for infections with a predominantly sexual mode of transmission: Secondary | ICD-10-CM | POA: Diagnosis present

## 2024-08-15 DIAGNOSIS — M5459 Other low back pain: Secondary | ICD-10-CM | POA: Diagnosis present

## 2024-08-15 LAB — URINALYSIS, ROUTINE W REFLEX MICROSCOPIC
Bilirubin Urine: NEGATIVE
Glucose, UA: NEGATIVE mg/dL
Ketones, ur: NEGATIVE mg/dL
Nitrite: NEGATIVE
Protein, ur: NEGATIVE mg/dL
Specific Gravity, Urine: 1.012 (ref 1.005–1.030)
pH: 6 (ref 5.0–8.0)

## 2024-08-15 NOTE — MAU Note (Signed)
 Beverly Dennis is a 18 y.o. at [redacted]w[redacted]d here in MAU reporting cramping in her lower back and legs today. Tonight when she wiped she had pink d/c on the tissue.  Reports dysuria. Last intercourse was last night.   LMP: na Onset of complaint: tonight Pain score: 7 Vitals:   08/15/24 2221 08/15/24 2222  BP:  (!) 117/58  Pulse: (!) 104   Resp: 17   Temp: 98.8 F (37.1 C)   SpO2: 100%      FHT: 158  Lab orders placed from triage: ua

## 2024-08-16 ENCOUNTER — Ambulatory Visit: Payer: Self-pay | Admitting: Family Medicine

## 2024-08-16 ENCOUNTER — Other Ambulatory Visit: Payer: Self-pay | Admitting: Obstetrics & Gynecology

## 2024-08-16 DIAGNOSIS — O26852 Spotting complicating pregnancy, second trimester: Secondary | ICD-10-CM | POA: Diagnosis not present

## 2024-08-16 DIAGNOSIS — O2312 Infections of bladder in pregnancy, second trimester: Secondary | ICD-10-CM

## 2024-08-16 DIAGNOSIS — R8271 Bacteriuria: Secondary | ICD-10-CM

## 2024-08-16 DIAGNOSIS — Z3A19 19 weeks gestation of pregnancy: Secondary | ICD-10-CM | POA: Diagnosis not present

## 2024-08-16 DIAGNOSIS — R300 Dysuria: Secondary | ICD-10-CM

## 2024-08-16 LAB — WET PREP, GENITAL
Clue Cells Wet Prep HPF POC: NONE SEEN
Sperm: NONE SEEN
Trich, Wet Prep: NONE SEEN
WBC, Wet Prep HPF POC: <10
Yeast Wet Prep HPF POC: NONE SEEN

## 2024-08-16 LAB — GC/CHLAMYDIA PROBE AMP (~~LOC~~) NOT AT ARMC
Chlamydia: NEGATIVE
Comment: NEGATIVE
Comment: NORMAL
Neisseria Gonorrhea: NEGATIVE

## 2024-08-16 MED ORDER — NITROFURANTOIN MONOHYD MACRO 100 MG PO CAPS: 100.0000 mg | ORAL_CAPSULE | Freq: Two times a day (BID) | ORAL | 1 refills | Status: DC

## 2024-08-16 MED ORDER — SULFAMETHOXAZOLE-TRIMETHOPRIM 800-160 MG PO TABS: 1.0000 | ORAL_TABLET | Freq: Two times a day (BID) | ORAL | 0 refills | Status: DC

## 2024-08-16 NOTE — Telephone Encounter (Signed)
 Phone call received from after hours nurse. Patient does not want to take bactrim  due to family history of allergy. Requesting alternative rx. Ok for macrobid  100 mg BID x 7 days.  Beverly Dennis Bring, MD, FACOG Obstetrician & Gynecologist, [facility] for [facility], [facility]

## 2024-08-16 NOTE — MAU Provider Note (Signed)
 Chief Complaint:  Vaginal Bleeding   HPI   None     Beverly Dennis is a 18 y.o. G1P0000 at [redacted]w[redacted]d who presents to maternity admissions reporting spotting.  She reports that when she wiped, she had pink discharge on the toilet paper.  She also endorses lower back and leg cramps throughout the day.  Endorses dysuria as well.  Last intercourse was last night.  Pregnancy Course: Receives care at [facility] for [facility] . Prenatal records reviewed.  Pregnancy complicated by GBS bacteriuria, chlamydia infection.  Past Medical History:  Diagnosis Date   Anxiety    Depression    OB History  Gravida Para Term Preterm AB Living  1 0 0 0 0 0  SAB IAB Ectopic Multiple Live Births  0 0 0 0 0    # Outcome Date GA Lbr Len/2nd Weight Sex Type Anes PTL Lv  1 Current            Past Surgical History:  Procedure Laterality Date   NO PAST SURGERIES     Family History  Problem Relation Age of Onset   Ovarian cancer Mother 64 - 19   Cancer - Ovarian Mother    Breast cancer Mother    Breast cancer Maternal Aunt    Ovarian cancer Maternal Aunt    Diabetes Maternal Grandfather    Social History[1] Allergies[2] Medications Prior to Admission  Medication Sig Dispense Refill Last Dose/Taking   albuterol (PROAIR HFA) 108 (90 Base) MCG/ACT inhaler INHALE 2 PUFFS BY MOUTH EVERY 4 TO 6 HOURS AS NEEDED FOR COUGHING OR WHEEZING (Patient not taking: Reported on 06/17/2024)      famotidine  (PEPCID ) 20 MG tablet Take 1 tablet (20 mg total) by mouth 2 (two) times daily. 60 tablet 3    Prenatal MV-Min-Fe Fum-FA-DHA (PRENATAL 1 PO) Take 1 tablet by mouth daily.      Prenatal Vit-Fe Fumarate-FA (PRENATAL PLUS VITAMIN/MINERAL) 27-1 MG TABS Take 1 tablet by mouth daily at 6 (six) AM. 90 tablet 3     I have reviewed patient's Past Medical Hx, Surgical Hx, Family Hx, Social Hx, medications and allergies.   ROS  Pertinent items noted in HPI and remainder of comprehensive ROS otherwise negative.    PHYSICAL EXAM  Patient Vitals for the past 24 hrs:  BP Temp Pulse Resp SpO2 Height Weight  08/15/24 2222 (!) 117/58 -- -- -- -- -- --  08/15/24 2221 -- 98.8 F (37.1 C) (!) 104 17 100 % 5' 1 (1.549 m) 51.7 kg  FHT: 158  Constitutional: Well-developed, well-nourished female in no acute distress.  HEENT: atraumatic, normocephalic. Neck has normal ROM. EOM intact. Cardiovascular: normal rate & rhythm, warm and well-perfused Respiratory: normal effort, no problems with respiration noted GI: Abd soft, non-tender, non-distended MSK: Extremities nontender, no edema, normal ROM Skin: warm and dry. Acyanotic, no jaundice or pallor. Neurologic: Alert and oriented x 4. No abnormal coordination. Psychiatric: Normal mood. Speech not slurred, not rapid/pressured. Patient is cooperative. Pelvic exam: VULVA: normal appearing vulva with no masses, tenderness or lesions, VAGINA: normal appearing vagina with normal color, tender with speculum insertion near introitus, no lesions, white discharge noted, CERVIX: nulliparous os, exam chaperoned by [facility] RN.      Labs: Results for orders placed or performed during the hospital encounter of 08/15/24 (from the past 24 hours)  Urinalysis, Routine w reflex microscopic -Urine, Clean Catch     Status: Abnormal   Collection Time: 08/15/24 10:25 PM  Result Value  Ref Range   Color, Urine YELLOW YELLOW   APPearance CLOUDY (A) CLEAR   Specific Gravity, Urine 1.012 1.005 - 1.030   pH 6.0 5.0 - 8.0   Glucose, UA NEGATIVE NEGATIVE mg/dL   Hgb urine dipstick MODERATE (A) NEGATIVE   Bilirubin Urine NEGATIVE NEGATIVE   Ketones, ur NEGATIVE NEGATIVE mg/dL   Protein, ur NEGATIVE NEGATIVE mg/dL   Nitrite NEGATIVE NEGATIVE   Leukocytes,Ua SMALL (A) NEGATIVE   RBC / HPF 0-5 0 - 5 RBC/hpf   WBC, UA 0-5 0 - 5 WBC/hpf   Bacteria, UA RARE (A) NONE SEEN   Squamous Epithelial / HPF 6-10 0 - 5 /HPF   Mucus PRESENT     Imaging:  No results found.  MDM &  MAU COURSE  MDM: Moderate  MAU Course: -Vital signs stable. FHT appropriate. -No placental abnormalities on US  on 12/16. No abdominal pain so low suspicion for placental abruption. -UA with  small leukocytes and rare bacteria, will send for culture. -No blood on speculum exam. Wet prep and GC/CT collected. Will call with abnormal results.  Differential diagnosis considered for 2nd or 3rd trimester vaginal bleeding includes but is not limited to: preterm labor, placenta previa, vasa previa, placental abruption, infection, marginal separation, cervical or vaginal lesion   Orders Placed This Encounter  Procedures   Culture, OB Urine   Wet prep, genital   Urinalysis, Routine w reflex microscopic -Urine, Clean Catch   Discharge patient   No orders of the defined types were placed in this encounter.   ASSESSMENT   1. Spotting affecting pregnancy in second trimester   2. [redacted] weeks gestation of pregnancy     PLAN  Discharge home in stable condition with return precautions.  Discussed that vaginal bleeding after intercourse is common during pregnancy. Heating pad, Tylenol prn for back and leg pain.     Allergies as of 08/16/2024   No Known Allergies      Medication List     TAKE these medications    famotidine  20 MG tablet Commonly known as: PEPCID  Take 1 tablet (20 mg total) by mouth 2 (two) times daily.   PRENATAL 1 PO Take 1 tablet by mouth daily.   Prenatal Plus Vitamin/Mineral 27-1 MG Tabs Take 1 tablet by mouth daily at 6 (six) AM.   ProAir HFA 108 (90 Base) MCG/ACT inhaler Generic drug: albuterol INHALE 2 PUFFS BY MOUTH EVERY 4 TO 6 HOURS AS NEEDED FOR COUGHING OR WHEEZING        Joesph DELENA Sear, PA     [1]  Social History Tobacco Use   Smoking status: Never    Passive exposure: Past   Smokeless tobacco: Never  Vaping Use   Vaping status: Former   Quit date: 07/18/2024   Devices: decreasing amt since discovering pregnancy  Substance Use Topics    Alcohol use: Not Currently    Comment: occ   Drug use: Not Currently    Types: Marijuana    Comment: last use years ago  [2] No Known Allergies

## 2024-08-16 NOTE — Progress Notes (Signed)
 Pt c/o dysuria and burning.  Culture pending from MAU yesterday.  UA looks contaminated with mucous.   Will treat with bactrim  bid for 7 days.  Pyelo precautions given (after hours nurse took phone call--see her documentation).

## 2024-08-16 NOTE — Discharge Instructions (Addendum)
 I will let you know if any of the swabs come back positive. If needed, you can take Tylenol 1000 mg every 6 hours for pain. Magnesium supplements are also helpful for muscle cramps.  VAGINAL BLEEDING DURING PREGNANCY: A small amount of bleeding from the vagina is common during pregnancy. Sometimes the bleeding is normal and does not cause problems. At other times, though, bleeding may be a sign of something serious. SECOND AND THIRD TRIMESTER Normal causes of bleeding during your second trimester are: Having sex. Having pelvic exams. Abnormal causes of bleeding during this time are: A problem with the cervix, such as: Infection or swelling. Growths. Cervical insufficiency. This is when the cervix opens early, before labor. A problem with the placenta. The placenta may: Block the opening of the cervix (placenta previa). Break away from the uterus (placental abruption). Attach too deeply into the uterus (placenta accreta). A problem with the pregnancy, such as: A pregnancy that didn't form well (molar pregnancy). Loss of pregnancy (miscarriage). Early labor.  Reasons to return to MAU at [facility]: Less than 36 weeks: Contractions feels like menstrual cramps. You should go to the hospital if you have more than 6 contractions in an hour, even after you have rested and drank at least 16 ounces of water.  More than 36 weeks: You begin to have strong, frequent contractions 5 minutes apart or less, each last 1 minute, these have been going on for 1-2 hours, and you cannot walk or talk during them. Your water breaks.  Sometimes it is a big gush of fluid. However, many times it may it may be much more subtle. You should go to the hospital if you have a constant leakage of fluid from your vagina, enough to soak a pad when you are walking around.  You have vaginal bleeding.  It is normal to have a small amount of spotting if your cervix was checked or you have recently  had intercourse. If you have bleeding requiring the use of a pad, go to the hospital. You don't feel your baby moving like normal.  If you think that you babys movement is decreased, eat a snack and rest on your left side in a quiet room for one hour. If you have not felt the baby move more than 6 times in an hour GO TO THE HOSPITAL.

## 2024-08-17 LAB — CULTURE, OB URINE: Culture: >=100000 — AB

## 2024-08-18 MED ORDER — AMOXICILLIN 500 MG PO CAPS: 500.0000 mg | ORAL_CAPSULE | Freq: Three times a day (TID) | ORAL | 0 refills | Status: AC

## 2024-08-18 NOTE — Addendum Note (Signed)
 Addended by: WALLACE SEARCH on: 08/18/2024 09:09 AM   Modules accepted: Orders

## 2024-08-26 ENCOUNTER — Encounter (HOSPITAL_COMMUNITY): Payer: Self-pay | Admitting: Obstetrics & Gynecology

## 2024-08-26 ENCOUNTER — Inpatient Hospital Stay (HOSPITAL_COMMUNITY)
Admission: AD | Admit: 2024-08-26 | Discharge: 2024-08-27 | Disposition: A | Discharge status: Home / Self Care | Payer: Self-pay | Attending: Obstetrics & Gynecology | Admitting: Obstetrics & Gynecology

## 2024-08-26 ENCOUNTER — Encounter: Admitting: Obstetrics and Gynecology

## 2024-08-26 DIAGNOSIS — M545 Low back pain, unspecified: Secondary | ICD-10-CM | POA: Diagnosis present

## 2024-08-26 DIAGNOSIS — O98512 Other viral diseases complicating pregnancy, second trimester: Secondary | ICD-10-CM | POA: Diagnosis not present

## 2024-08-26 DIAGNOSIS — Z3A21 21 weeks gestation of pregnancy: Secondary | ICD-10-CM | POA: Insufficient documentation

## 2024-08-26 DIAGNOSIS — B349 Viral infection, unspecified: Secondary | ICD-10-CM | POA: Insufficient documentation

## 2024-08-26 DIAGNOSIS — B9789 Other viral agents as the cause of diseases classified elsewhere: Secondary | ICD-10-CM

## 2024-08-26 DIAGNOSIS — M7918 Myalgia, other site: Secondary | ICD-10-CM | POA: Diagnosis not present

## 2024-08-26 DIAGNOSIS — Z113 Encounter for screening for infections with a predominantly sexual mode of transmission: Secondary | ICD-10-CM | POA: Diagnosis present

## 2024-08-26 DIAGNOSIS — R1030 Lower abdominal pain, unspecified: Secondary | ICD-10-CM | POA: Diagnosis present

## 2024-08-26 LAB — CBC WITH DIFFERENTIAL/PLATELET
Abs Immature Granulocytes: 0.09 K/uL — ABNORMAL HIGH (ref 0.00–0.07)
Basophils Absolute: 0.1 K/uL (ref 0.0–0.1)
Basophils Relative: 0 %
Eosinophils Absolute: 0 K/uL (ref 0.0–0.5)
Eosinophils Relative: 0 %
HCT: 34.7 % — ABNORMAL LOW (ref 36.0–46.0)
Hemoglobin: 12.1 g/dL (ref 12.0–15.0)
Immature Granulocytes: 1 %
Lymphocytes Relative: 6 %
Lymphs Abs: 1 K/uL (ref 0.7–4.0)
MCH: 30.6 pg (ref 26.0–34.0)
MCHC: 34.9 g/dL (ref 30.0–36.0)
MCV: 87.8 fL (ref 80.0–100.0)
Monocytes Absolute: 0.6 K/uL (ref 0.1–1.0)
Monocytes Relative: 4 %
Neutro Abs: 15 K/uL — ABNORMAL HIGH (ref 1.7–7.7)
Neutrophils Relative %: 89 %
Platelets: 272 K/uL (ref 150–400)
RBC: 3.95 MIL/uL (ref 3.87–5.11)
RDW: 12 % (ref 11.5–15.5)
WBC: 16.8 K/uL — ABNORMAL HIGH (ref 4.0–10.5)
nRBC: 0 % (ref 0.0–0.2)

## 2024-08-26 LAB — COMPREHENSIVE METABOLIC PANEL WITH GFR
ALT: 16 U/L (ref 0–44)
AST: 19 U/L (ref 15–41)
Albumin: 3.9 g/dL (ref 3.5–5.0)
Alkaline Phosphatase: 58 U/L (ref 38–126)
Anion gap: 11 (ref 5–15)
BUN: 8 mg/dL (ref 6–20)
CO2: 23 mmol/L (ref 22–32)
Calcium: 8.8 mg/dL — ABNORMAL LOW (ref 8.9–10.3)
Chloride: 102 mmol/L (ref 98–111)
Creatinine, Ser: 0.51 mg/dL (ref 0.44–1.00)
GFR, Estimated: >60 mL/min
Glucose, Bld: 92 mg/dL (ref 70–99)
Potassium: 3.7 mmol/L (ref 3.5–5.1)
Sodium: 136 mmol/L (ref 135–145)
Total Bilirubin: 0.2 mg/dL (ref 0.0–1.2)
Total Protein: 6.5 g/dL (ref 6.5–8.1)

## 2024-08-26 LAB — URINALYSIS, ROUTINE W REFLEX MICROSCOPIC
Bacteria, UA: NONE SEEN
Bilirubin Urine: NEGATIVE
Glucose, UA: NEGATIVE mg/dL
Hgb urine dipstick: NEGATIVE
Ketones, ur: 5 mg/dL — AB
Leukocytes,Ua: NEGATIVE
Nitrite: NEGATIVE
Protein, ur: NEGATIVE mg/dL
Specific Gravity, Urine: 1.013 (ref 1.005–1.030)
pH: 6 (ref 5.0–8.0)

## 2024-08-26 LAB — WET PREP, GENITAL
Clue Cells Wet Prep HPF POC: NONE SEEN
Sperm: NONE SEEN
Trich, Wet Prep: NONE SEEN
WBC, Wet Prep HPF POC: <10
Yeast Wet Prep HPF POC: NONE SEEN

## 2024-08-26 MED ORDER — LACTATED RINGERS IV BOLUS
1000.0000 mL | Freq: Once | INTRAVENOUS | Status: AC
  Administered 2024-08-26: 1000 mL via INTRAVENOUS

## 2024-08-26 MED ORDER — ACETAMINOPHEN 10 MG/ML IV SOLN
1000.0000 mg | Freq: Once | INTRAVENOUS | Status: AC
  Administered 2024-08-26: 1000 mg via INTRAVENOUS
  Filled 2024-08-26: qty 100

## 2024-08-26 MED ORDER — ONDANSETRON HCL 4 MG/2ML IJ SOLN
4.0000 mg | Freq: Once | INTRAMUSCULAR | Status: AC
  Administered 2024-08-26: 4 mg via INTRAVENOUS
  Filled 2024-08-26: qty 2

## 2024-08-26 NOTE — MAU Note (Signed)
 MAU Triage Note: Beverly Dennis is a 18 y.o. at [redacted]w[redacted]d here in MAU reporting: reporting new onset severe lower abdominal and back pain. Unable to describe how the pain feels but rates at 10/10. Denies VB or watery LOF. Reports nausea and emesis since the pain started.  Patient complaint: sever lower back and stomach pain, vomiting  Pain Score: 10-Worst pain ever Pain Location: Abdomen Pain Score: 10 Pain Location: Back   Onset of complaint: today LMP: Patient's last menstrual period was 03/27/2024 (exact date).  Vitals:   08/26/24 2200  BP: 115/72  Pulse: 95  Resp: 18  Temp: 98.2 F (36.8 C)  SpO2: 100%    FHT:  Fetal Heart Rate Mode: Doppler Baseline Rate (A): 154 bpm Multiple birth?: No Lab orders placed from triage: UA

## 2024-08-27 ENCOUNTER — Ambulatory Visit: Payer: Self-pay

## 2024-08-27 DIAGNOSIS — B349 Viral infection, unspecified: Secondary | ICD-10-CM

## 2024-08-27 DIAGNOSIS — O98512 Other viral diseases complicating pregnancy, second trimester: Secondary | ICD-10-CM | POA: Diagnosis not present

## 2024-08-27 DIAGNOSIS — Z3A21 21 weeks gestation of pregnancy: Secondary | ICD-10-CM

## 2024-08-27 LAB — RESP PANEL BY RT-PCR (RSV, FLU A&B, COVID)  RVPGX2
Influenza A by PCR: NEGATIVE
Influenza B by PCR: NEGATIVE
Resp Syncytial Virus by PCR: NEGATIVE
SARS Coronavirus 2 by RT PCR: NEGATIVE

## 2024-08-27 LAB — GC/CHLAMYDIA PROBE AMP (~~LOC~~) NOT AT ARMC
Chlamydia: NEGATIVE
Comment: NEGATIVE
Comment: NORMAL
Neisseria Gonorrhea: NEGATIVE

## 2024-08-27 NOTE — Discharge Instructions (Signed)
 Safe Medications in Pregnancy  - Take medications as directed on the package. Medications are listed by Brand name, store brands are considered equivalent to brand name, just be sure that the medications are the same. Ex: Tylenol (acetaminophen) - If taking multiple medications, please check labels to avoid duplicating the same active ingredients - Do not exceed 4000 mg of Tylenol (acetaminophen) in 24 hours - Do not take medications that contain aspirin  or ibuprofen  (Motrin , Advil ) or naproxen (Aleve, Naprosyn)  Acne Benzoyl Peroxide Salicylic Acid  Allergies Benadryl  (diphenhydramine ) Claritin (loratadine) Flonase nasal spray (fluticasone) Saline nasal spray/drops  Backache/Headache Acetaminophen (Tylenol): 2 regular strength (325mg ) every 4 hours OR 2 extra strength (500mg ) every 6 hours  Colds/Coughs Breathe right strips Cepacol throat lozenges OR Chloraseptic throat spray cough drops, alcohol free Delsym (dextromethorphan, cough suppressant) Mucinex (guaifenesin, mucolytic/expectorant) Robitussin DM (dextromethorphan + guaifenesin) Saline nasal spray/drops Sudafed (pseudoephedrine, decongestant)  only after [redacted] weeks gestation and if you do not have high blood pressure Vicks Vaporub Zinc lozenges Zyrtec (cetirizine)  Constipation Immediate relief Ducolax suppositories (bisacodyl) Fleet enema (saline enema) glycerin suppositories milk of magnesia Senokot (overnight) Smooth move tea (overnight)  to keep you regular Colace, Dulcolax (docusate) Metamucil (psyllium fiber) Miralax (polyethylene glycol)   Diarrhea Kaopectate (bismuth subsalicylate) Imodium A-D (loperamide) *NO pepto Bismol Hemorrhoids Anusol   Anusol  HC (Rx only) Preparation H Tucks  Indigestion Tums Maalox Mylanta Pepcid  Insomnia Benadryl  (alcohol free) 25mg  every 6 hours as needed Tylenol PM Unisom, no Gelcaps  Leg  Cramps Tums MagGel  Nausea/Vomiting Bonine Dramamine Emetrol Ginger extract Sea bands Meclizine (Rx only)  Nausea medication to take during pregnancy Unisom (doxylamine succinate 25 mg tablets) Take one half tablet daily at bedtime. Vitamin B6 100mg  tablets. Take one tablet twice a day (up to 200 mg per day).  Skin Rashes: Aveeno products Benadryl  cream or 25mg  pill every 6 hours as needed Calamine Lotion 1% cortisone cream  Yeast infection: Gyne-lotrimin 7 Monistat 7

## 2024-08-27 NOTE — MAU Provider Note (Signed)
 " History     CSN: [id number]  Arrival date and time: 08/26/24 2138 First Provider Initiated Contact with Patient 08/26/2024 10:01 PM  Chief Complaint  Patient presents with   Abdominal Pain   Back Pain    HPI Ogechi Kuehnel is a 18 y.o. G1P0000 at [redacted]w[redacted]d, 01/06/2025, by Ultrasound, who presents to the Maternity Assessment Unit for back/flank pain.  She indicates left low back/hip pain. She reports the pain wraps around to her stomach. It was previously waxing/waning but now it is constant. She cannot describe the pain or aggravating factors. She reports being recently treated for a UTI. Her partner mentions that she tried a heat pad and a warm bath which offered little relief.   ROS (+) back/side pain, n/v (-) dysuria, change in vaginal discharge   Past Medical History:  Diagnosis Date   Anxiety    Depression    Past Surgical History:  Procedure Laterality Date   NO PAST SURGERIES     Allergies[1]  Physical Exam  BP 115/72 (BP Location: Right Arm)   Pulse 95   Temp 98.2 F (36.8 C) (Oral)   Resp 18   Ht 5' 1 (1.549 m)   Wt 52.5 kg   LMP 03/27/2024 (Exact Date)   SpO2 100%   BMI 21.88 kg/m   Gen: alert, no acute distress CV: regular rate Resp: nonlabored Abd: soft, tender, nondistended MSK: midline spine nontender and R paraspinal and lumbar regions nontender. L side lumbar region tender to light touch, worst at iliac crest. No swelling, erythema, or bruising noted.    Labs O/Positive/-- (10/14 0825)   Results for orders placed or performed during the hospital encounter of 08/26/24 (from the past 24 hours)  Urinalysis, Routine w reflex microscopic -Urine, Random     Status: Abnormal   Collection Time: 08/26/24 10:17 PM  Result Value Ref Range   Color, Urine YELLOW YELLOW   APPearance CLOUDY (A) CLEAR   Specific Gravity, Urine 1.013 1.005 - 1.030   pH 6.0 5.0 - 8.0   Glucose, UA NEGATIVE NEGATIVE mg/dL   Hgb urine dipstick NEGATIVE NEGATIVE   Bilirubin  Urine NEGATIVE NEGATIVE   Ketones, ur 5 (A) NEGATIVE mg/dL   Protein, ur NEGATIVE NEGATIVE mg/dL   Nitrite NEGATIVE NEGATIVE   Leukocytes,Ua NEGATIVE NEGATIVE   RBC / HPF 0-5 0 - 5 RBC/hpf   WBC, UA 0-5 0 - 5 WBC/hpf   Bacteria, UA NONE SEEN NONE SEEN   Squamous Epithelial / HPF 11-20 0 - 5 /HPF   Mucus PRESENT   Wet prep, genital     Status: None   Collection Time: 08/26/24 10:49 PM  Result Value Ref Range   Yeast Wet Prep HPF POC NONE SEEN NONE SEEN   Trich, Wet Prep NONE SEEN NONE SEEN   Clue Cells Wet Prep HPF POC NONE SEEN NONE SEEN   WBC, Wet Prep HPF POC <10 <10   Sperm NONE SEEN   Comprehensive metabolic panel with GFR     Status: Abnormal   Collection Time: 08/26/24 11:04 PM  Result Value Ref Range   Sodium 136 135 - 145 mmol/L   Potassium 3.7 3.5 - 5.1 mmol/L   Chloride 102 98 - 111 mmol/L   CO2 23 22 - 32 mmol/L   Glucose, Bld 92 70 - 99 mg/dL   BUN 8 6 - 20 mg/dL   Creatinine, Ser 9.48 0.44 - 1.00 mg/dL   Calcium 8.8 (L) 8.9 - 10.3 mg/dL  Total Protein 6.5 6.5 - 8.1 g/dL   Albumin 3.9 3.5 - 5.0 g/dL   AST 19 15 - 41 U/L   ALT 16 0 - 44 U/L   Alkaline Phosphatase 58 38 - 126 U/L   Total Bilirubin 0.2 0.0 - 1.2 mg/dL   GFR, Estimated >39 >39 mL/min   Anion gap 11 5 - 15  CBC with Differential/Platelet     Status: Abnormal   Collection Time: 08/26/24 11:04 PM  Result Value Ref Range   WBC 16.8 (H) 4.0 - 10.5 K/uL   RBC 3.95 3.87 - 5.11 MIL/uL   Hemoglobin 12.1 12.0 - 15.0 g/dL   HCT 65.2 (L) 63.9 - 53.9 %   MCV 87.8 80.0 - 100.0 fL   MCH 30.6 26.0 - 34.0 pg   MCHC 34.9 30.0 - 36.0 g/dL   RDW 87.9 88.4 - 84.4 %   Platelets 272 150 - 400 K/uL   nRBC 0.0 0.0 - 0.2 %   Neutrophils Relative % 89 %   Neutro Abs 15.0 (H) 1.7 - 7.7 K/uL   Lymphocytes Relative 6 %   Lymphs Abs 1.0 0.7 - 4.0 K/uL   Monocytes Relative 4 %   Monocytes Absolute 0.6 0.1 - 1.0 K/uL   Eosinophils Relative 0 %   Eosinophils Absolute 0.0 0.0 - 0.5 K/uL   Basophils Relative 0 %    Basophils Absolute 0.1 0.0 - 0.1 K/uL   Immature Granulocytes 1 %   Abs Immature Granulocytes 0.09 (H) 0.00 - 0.07 K/uL  Resp panel by RT-PCR (RSV, Flu A&B, Covid) Anterior Nasal Swab     Status: None   Collection Time: 08/27/24  1:06 AM   Specimen: Anterior Nasal Swab  Result Value Ref Range   SARS Coronavirus 2 by RT PCR NEGATIVE NEGATIVE   Influenza A by PCR NEGATIVE NEGATIVE   Influenza B by PCR NEGATIVE NEGATIVE   Resp Syncytial Virus by PCR NEGATIVE NEGATIVE    Assessment and Plan  MDM Lashay Osborne is a 18 y.o. G1P0000 at [redacted]w[redacted]d, 01/06/2025, by Ultrasound, who presents to the MAU for back pain. Ddx: back pain includes but is not limited to: nephrolithiasis, constipation, acute back strain, postpartum back pain, degenerative spine disease, disc herniation, epidural abscess, cauda equina, AAA, spine fracture, pyelonephritis, viral myalgia.  Orders Placed This Encounter  Procedures   Wet prep, genital   Resp panel by RT-PCR (RSV, Flu A&B, Covid) Anterior Nasal Swab   Urinalysis, Routine w reflex microscopic -Urine, Random   Comprehensive metabolic panel with GFR   CBC with Differential/Platelet   Discharge patient   Meds ordered this encounter  Medications   acetaminophen  (OFIRMEV ) IV 1,000 mg    Is the patient UNABLE to take oral / enteral medications?:   Yes   lactated ringers  bolus 1,000 mL   ondansetron  (ZOFRAN ) injection 4 mg   Pain and nausea have improved with the above listed medication. No signs of UTI on UA. CBC shows leukocytosis with neutrophil predominance. If patient returns with similar complaint, can consider US  or CT for nephrolithiasis, however I would have expected blood in the UA even if she had passed the stone prior to arrival. COVID/flu/RSV swab pending. Discussed today's results and low concern for nephrolithiasis, pyelonephritis/ascending infection. Probable viral etiology, anticipate peak day 3-5. Safe meds list provided in AVS. She has Zofran  at home  which helps if I can keep it down. Declines antiemetic suppository rx.  1. [redacted] weeks gestation of pregnancy (Primary) - Discharge  patient  2. Myalgia due to viral infection  Results pending at the time of DC: COVID/flu/RSV, GC/CT Dispo: DC home in stable condition with return precautions discussed and included in AVS.    Barabara Maier, DO FM-OB Fellow [facility] for [facility]     [1] No Known Allergies  "

## 2024-08-28 ENCOUNTER — Ambulatory Visit: Admitting: Obstetrics and Gynecology

## 2024-08-28 VITALS — BP 114/70 | HR 94 | Wt 115.0 lb

## 2024-08-28 DIAGNOSIS — O0992 Supervision of high risk pregnancy, unspecified, second trimester: Secondary | ICD-10-CM

## 2024-08-28 DIAGNOSIS — M549 Dorsalgia, unspecified: Secondary | ICD-10-CM | POA: Diagnosis not present

## 2024-08-28 DIAGNOSIS — R8271 Bacteriuria: Secondary | ICD-10-CM

## 2024-08-28 DIAGNOSIS — O99891 Other specified diseases and conditions complicating pregnancy: Secondary | ICD-10-CM

## 2024-08-28 DIAGNOSIS — Z3A21 21 weeks gestation of pregnancy: Secondary | ICD-10-CM

## 2024-08-28 LAB — POCT URINALYSIS DIPSTICK
Bilirubin, UA: NEGATIVE
Glucose, UA: NEGATIVE
Ketones, UA: NEGATIVE
Leukocytes, UA: NEGATIVE
Nitrite, UA: NEGATIVE
Protein, UA: NEGATIVE
Spec Grav, UA: 1.01
Urobilinogen, UA: 0.2 U/dL
pH, UA: 6.5

## 2024-08-28 MED ORDER — CYCLOBENZAPRINE HCL 5 MG PO TABS: 5.0000 mg | ORAL_TABLET | Freq: Three times a day (TID) | ORAL | 0 refills | Status: AC | PRN

## 2024-08-28 NOTE — Progress Notes (Signed)
 Pt was seen in MAU on Monday for pain. Pt states they found nothing wrong - pt states she did have elevated WBC.  Pt states pain has gotten better but still present . Pt still having left flank pain and cramping.   Pt is currently taking Amoxicillin  - states was given to her for UTI.

## 2024-08-28 NOTE — Progress Notes (Signed)
" ° °  PRENATAL VISIT NOTE  Subjective:  Beverly Dennis is a 18 y.o. G1P0000 at [redacted]w[redacted]d being seen today for ongoing prenatal care.  She is currently monitored for the following issues for this low-risk pregnancy and has Supervision of high risk pregnancy, antepartum, second trimester; Bipolar disorder (HCC); Depressive disorder; History of suicidal ideation; Chlamydia infection affecting pregnancy; GBS bacteriuria; and Current every day vaping on their problem list.  Patient doing well with no acute concerns today. She reports left lower back pain.  Contractions: Irritability. Vag. Bleeding: None.  Movement: Present. Denies leaking of fluid.  Pt was diagnosed with GBS UTI on 08/15/24.  She continues her amoxicillin  for treatment. No fever/chills.  Pt recently seen in  MAU with back pain and nausea.  UA was negative at that time.  Back pain has improved, but pt still has some.  No current nausea.  The following portions of the patient's history were reviewed and updated as appropriate: allergies, current medications, past family history, past medical history, past social history, past surgical history and problem list. Problem list updated.  Objective:   Vitals:   08/28/24 1424  BP: 114/70  Pulse: 94  Weight: 115 lb (52.2 kg)     Fetal Status: Fetal Heart Rate (bpm): 155 Fundal Height: 20 cm Movement: Present     General:  Alert, oriented and cooperative. Patient is in no acute distress.  Skin: Skin is warm and dry. No rash noted.   Cardiovascular: Normal heart rate noted  Respiratory: Normal respiratory effort, no problems with respiration noted  Abdomen: Soft, gravid, appropriate for gestational age.  Pain/Pressure: Present     Pelvic: Cervical exam deferred        Extremities: Normal range of motion.     Mental Status:  Normal mood and affect. Normal behavior. Normal judgment and thought content.   Assessment and Plan:  Pregnancy: G1P0000 at [redacted]w[redacted]d  1. [redacted] weeks gestation of pregnancy  (Primary)  - POCT urinalysis dipstick  2. Supervision of high risk pregnancy, antepartum, second trimester Continue routine prenatal care - POCT urinalysis dipstick  3. GBS bacteriuria UA had small blood today, but patient is already on oral abx Flexeril sent to help with back pain Repeat urine culture.  Pt advised to return to MAU if symptoms worsen.  Low threshold for  renal ultrasound or CT .  If any real fevers or worsening flank pain, admit for IV abx - Urine Culture  Preterm labor symptoms and general obstetric precautions including but not limited to vaginal bleeding, contractions, leaking of fluid and fetal movement were reviewed in detail with the patient.  Please refer to After Visit Summary for other counseling recommendations.   Return in about 4 weeks (around 09/25/2024) for ROB, in person, 2 hr GTT, 3rd trim labs.   Jerilynn Buddle, MD Faculty Attending Center for [facility]   "

## 2024-08-30 ENCOUNTER — Ambulatory Visit: Payer: Self-pay | Admitting: Obstetrics and Gynecology

## 2024-08-30 LAB — URINE CULTURE

## 2024-09-10 ENCOUNTER — Ambulatory Visit: Attending: Maternal & Fetal Medicine

## 2024-09-10 ENCOUNTER — Ambulatory Visit: Admitting: Obstetrics and Gynecology

## 2024-09-10 ENCOUNTER — Other Ambulatory Visit: Payer: Self-pay | Admitting: *Deleted

## 2024-09-10 VITALS — BP 113/57

## 2024-09-10 DIAGNOSIS — O0992 Supervision of high risk pregnancy, unspecified, second trimester: Secondary | ICD-10-CM | POA: Diagnosis present

## 2024-09-10 DIAGNOSIS — Z3A23 23 weeks gestation of pregnancy: Secondary | ICD-10-CM

## 2024-09-10 DIAGNOSIS — A749 Chlamydial infection, unspecified: Secondary | ICD-10-CM

## 2024-09-10 DIAGNOSIS — O99332 Smoking (tobacco) complicating pregnancy, second trimester: Secondary | ICD-10-CM

## 2024-09-10 DIAGNOSIS — O99330 Smoking (tobacco) complicating pregnancy, unspecified trimester: Secondary | ICD-10-CM

## 2024-09-10 DIAGNOSIS — Z7289 Other problems related to lifestyle: Secondary | ICD-10-CM | POA: Insufficient documentation

## 2024-09-10 DIAGNOSIS — O98812 Other maternal infectious and parasitic diseases complicating pregnancy, second trimester: Secondary | ICD-10-CM | POA: Diagnosis present

## 2024-09-10 DIAGNOSIS — Z3A19 19 weeks gestation of pregnancy: Secondary | ICD-10-CM | POA: Insufficient documentation

## 2024-09-10 DIAGNOSIS — O358XX0 Maternal care for other (suspected) fetal abnormality and damage, not applicable or unspecified: Secondary | ICD-10-CM

## 2024-09-10 NOTE — Progress Notes (Signed)
 Maternal-Fetal Medicine Consultation  Name: Beverly Dennis  MRN: [id number]  GA: G1P0000 [redacted]w[redacted]d   Patient is here for fetal growth assessment.  She uses tobacco (vaping) and has nicotine patches not prescribed by her provider.  Ultrasound Normal fetal growth and amniotic fluid.  Fetal cardiac anatomy appears normal.  I discussed the possible complications of tobacco use in pregnancy that include fetal growth restriction.  I counseled her on nicotine replacement therapy that has shown increased to quit rates and the dosage can be tapered gradually.  Patient would like to consider nicotine replacement therapy and will discuss with her provider.  Recommendations Fetal growth assessment in 9 weeks     Consultation including face-to-face (more than 50%) counseling 20 minutes.

## 2024-09-25 ENCOUNTER — Other Ambulatory Visit: Payer: Self-pay

## 2024-09-25 ENCOUNTER — Encounter: Payer: Self-pay | Admitting: Obstetrics and Gynecology

## 2024-09-25 ENCOUNTER — Ambulatory Visit: Payer: Self-pay | Admitting: Obstetrics and Gynecology

## 2024-09-25 VITALS — BP 112/65 | HR 102 | Wt 117.0 lb

## 2024-09-25 DIAGNOSIS — Z7289 Other problems related to lifestyle: Secondary | ICD-10-CM | POA: Diagnosis not present

## 2024-09-25 DIAGNOSIS — O0992 Supervision of high risk pregnancy, unspecified, second trimester: Secondary | ICD-10-CM

## 2024-09-25 DIAGNOSIS — O99342 Other mental disorders complicating pregnancy, second trimester: Secondary | ICD-10-CM | POA: Diagnosis not present

## 2024-09-25 DIAGNOSIS — R8271 Bacteriuria: Secondary | ICD-10-CM

## 2024-09-25 DIAGNOSIS — F319 Bipolar disorder, unspecified: Secondary | ICD-10-CM

## 2024-09-25 DIAGNOSIS — O26892 Other specified pregnancy related conditions, second trimester: Secondary | ICD-10-CM

## 2024-09-25 DIAGNOSIS — Z3A25 25 weeks gestation of pregnancy: Secondary | ICD-10-CM

## 2024-09-25 DIAGNOSIS — R12 Heartburn: Secondary | ICD-10-CM

## 2024-09-25 DIAGNOSIS — F32A Depression, unspecified: Secondary | ICD-10-CM

## 2024-09-25 NOTE — Progress Notes (Addendum)
" ° °  PRENATAL VISIT NOTE  Subjective:  Beverly Dennis is a 19 y.o. G1P0000 at [redacted]w[redacted]d being seen today for ongoing prenatal care.  She is currently monitored for the following issues for this low-risk pregnancy and has Supervision of high risk pregnancy, antepartum, second trimester; Bipolar disorder (HCC); Depressive disorder; History of suicidal ideation; Chlamydia infection affecting pregnancy; GBS bacteriuria; and Current every day vaping on their problem list.  Patient reports heartburn, tums not helping, has pepcid , hasn't taken it .  Contractions: Not present. Vag. Bleeding: None.  Movement: Present. Denies leaking of fluid.   The following portions of the patient's history were reviewed and updated as appropriate: allergies, current medications, past family history, past medical history, past social history, past surgical history and problem list.   Objective:   Vitals:   09/25/24 0836  BP: 112/65  Pulse: (!) 102  Weight: 117 lb (53.1 kg)    Fetal Status:  Fetal Heart Rate (bpm): 145 Fundal Height: 95 cm Movement: Present    General: Alert, oriented and cooperative. Patient is in no acute distress.  Skin: Skin is warm and dry. No rash noted.   Cardiovascular: Normal heart rate noted  Respiratory: Normal respiratory effort, no problems with respiration noted  Abdomen: Soft, gravid, appropriate for gestational age.  Pain/Pressure: Absent     Pelvic: Cervical exam deferred        Extremities: Normal range of motion.  Edema: None  Mental Status: Normal mood and affect. Normal behavior. Normal judgment and thought content.     Assessment and Plan:  Pregnancy: G1P0000 at [redacted]w[redacted]d 1. Supervision of high risk pregnancy, antepartum, second trimester (Primary) BP and FHR normal Doing well, feeling regular movement    2. GBS bacteriuria S/p tx, tx in labor   3. Current every day vaping 1/13  efw 17% normal anatomy, follow up growth 3/17 Encouraged to continue vaping  cessation Declines nicotine replacement   4. Bipolar affective disorder, remission status unspecified (HCC) 5. Depressive disorder   6. [redacted] weeks gestation of pregnancy GTT and labs today   7. Heartburn during pregnancy in second trimester Discussed pepcid  use start at bedtime, if needed can do twice a day Discussed dietary limitations    Preterm labor symptoms and general obstetric precautions including but not limited to vaginal bleeding, contractions, leaking of fluid and fetal movement were reviewed in detail with the patient. Please refer to After Visit Summary for other counseling recommendations.   Return in about 2 weeks (around 10/09/2024), or with Tino Ronan, for OB VISIT (MD or APP).  Future Appointments  Date Time Provider Department Center  10/14/2024  1:30 PM Wallace Joesph LABOR, [place] [facility] None  11/12/2024  8:15 AM [facility] PROVIDER 1 [facility]  11/12/2024  8:30 AM [facility] US[facility]    Nidia Daring, FNP  "

## 2024-09-25 NOTE — Progress Notes (Signed)
 Pt presents for ROB visit. Pt has concerns about weight gain during pregnancy.

## 2024-09-26 ENCOUNTER — Ambulatory Visit: Payer: Self-pay | Admitting: Obstetrics and Gynecology

## 2024-09-26 DIAGNOSIS — O0992 Supervision of high risk pregnancy, unspecified, second trimester: Secondary | ICD-10-CM

## 2024-09-26 LAB — GLUCOSE TOLERANCE, 2 HOURS W/ 1HR
Glucose, 1 hour: 85 mg/dL (ref 70–179)
Glucose, 2 hour: 73 mg/dL (ref 70–152)
Glucose, Fasting: 69 mg/dL — ABNORMAL LOW (ref 70–91)

## 2024-09-26 LAB — CBC
Hematocrit: 36.2 % (ref 34.0–46.6)
Hemoglobin: 11.9 g/dL (ref 11.1–15.9)
MCH: 29.9 pg (ref 26.6–33.0)
MCHC: 32.9 g/dL (ref 31.5–35.7)
MCV: 91 fL (ref 79–97)
Platelets: 264 10*3/uL (ref 150–450)
RBC: 3.98 x10E6/uL (ref 3.77–5.28)
RDW: 12.4 % (ref 11.7–15.4)
WBC: 12 10*3/uL — ABNORMAL HIGH (ref 3.4–10.8)

## 2024-09-26 LAB — HIV ANTIBODY (ROUTINE TESTING W REFLEX): HIV Screen 4th Generation wRfx: NONREACTIVE

## 2024-09-26 LAB — SYPHILIS: RPR W/REFLEX TO RPR TITER AND TREPONEMAL ANTIBODIES, TRADITIONAL SCREENING AND DIAGNOSIS ALGORITHM: RPR Ser Ql: NONREACTIVE

## 2024-10-14 ENCOUNTER — Encounter: Payer: Self-pay | Admitting: Family Medicine

## 2024-10-14 ENCOUNTER — Encounter: Admitting: Family Medicine

## 2024-11-12 ENCOUNTER — Other Ambulatory Visit

## 2024-11-12 ENCOUNTER — Ambulatory Visit
